# Patient Record
Sex: Male | Born: 1955 | Race: Black or African American | Hispanic: No | Marital: Single | Smoking: Current every day smoker
Health system: Southern US, Community
[De-identification: ages and names within clinical notes are randomized; demographics above are authoritative.]

## PROBLEM LIST (undated history)

## (undated) ENCOUNTER — Emergency Department: Discharge status: Absent without leave | Payer: Medicaid Other

## (undated) DIAGNOSIS — M1611 Unilateral primary osteoarthritis, right hip: Secondary | ICD-10-CM

---

## 2005-12-02 ENCOUNTER — Emergency Department: Payer: Self-pay

## 2006-12-31 ENCOUNTER — Emergency Department: Payer: Self-pay | Admitting: Emergency Medicine

## 2007-12-29 ENCOUNTER — Emergency Department: Payer: Self-pay

## 2010-04-14 ENCOUNTER — Emergency Department: Payer: Self-pay | Admitting: Emergency Medicine

## 2010-08-27 ENCOUNTER — Emergency Department: Payer: Self-pay | Admitting: Internal Medicine

## 2010-12-28 ENCOUNTER — Emergency Department: Payer: Self-pay | Admitting: *Deleted

## 2012-08-20 ENCOUNTER — Emergency Department: Payer: Self-pay | Admitting: Emergency Medicine

## 2012-09-01 ENCOUNTER — Emergency Department: Payer: Self-pay | Admitting: Emergency Medicine

## 2012-10-19 ENCOUNTER — Emergency Department: Payer: Self-pay | Admitting: Emergency Medicine

## 2012-10-19 LAB — CBC
HCT: 39 % — ABNORMAL LOW (ref 40.0–52.0)
HGB: 12.4 g/dL — ABNORMAL LOW (ref 13.0–18.0)
MCH: 24.7 pg — ABNORMAL LOW (ref 26.0–34.0)
MCHC: 31.9 g/dL — ABNORMAL LOW (ref 32.0–36.0)
MCV: 77 fL — ABNORMAL LOW (ref 80–100)
Platelet: 294 10*3/uL (ref 150–440)
WBC: 6.2 10*3/uL (ref 3.8–10.6)

## 2012-10-19 LAB — COMPREHENSIVE METABOLIC PANEL
Albumin: 3.5 g/dL (ref 3.4–5.0)
Alkaline Phosphatase: 99 U/L (ref 50–136)
Co2: 25 mmol/L (ref 21–32)
Creatinine: 1.21 mg/dL (ref 0.60–1.30)
EGFR (Non-African Amer.): >60
Glucose: 96 mg/dL (ref 65–99)
Osmolality: 279 (ref 275–301)
Potassium: 4.2 mmol/L (ref 3.5–5.1)
SGOT(AST): 27 U/L (ref 15–37)
SGPT (ALT): 26 U/L (ref 12–78)

## 2012-10-19 LAB — URIC ACID: Uric Acid: 6.1 mg/dL (ref 3.5–7.2)

## 2012-10-24 LAB — CULTURE, BLOOD (SINGLE)

## 2013-10-27 ENCOUNTER — Emergency Department: Payer: Self-pay | Admitting: Emergency Medicine

## 2013-10-27 LAB — COMPREHENSIVE METABOLIC PANEL
ANION GAP: 5 — AB (ref 7–16)
Albumin: 3.5 g/dL (ref 3.4–5.0)
Alkaline Phosphatase: 101 U/L
BUN: 7 mg/dL (ref 7–18)
Bilirubin,Total: 0.2 mg/dL (ref 0.2–1.0)
CHLORIDE: 109 mmol/L — AB (ref 98–107)
Calcium, Total: 8.2 mg/dL — ABNORMAL LOW (ref 8.5–10.1)
Co2: 24 mmol/L (ref 21–32)
Creatinine: 0.93 mg/dL (ref 0.60–1.30)
EGFR (African American): >60
EGFR (Non-African Amer.): >60
Glucose: 132 mg/dL — ABNORMAL HIGH (ref 65–99)
OSMOLALITY: 276 (ref 275–301)
POTASSIUM: 4.2 mmol/L (ref 3.5–5.1)
SGOT(AST): 23 U/L (ref 15–37)
SGPT (ALT): 31 U/L
SODIUM: 138 mmol/L (ref 136–145)
TOTAL PROTEIN: 7.7 g/dL (ref 6.4–8.2)

## 2013-10-27 LAB — CBC WITH DIFFERENTIAL/PLATELET
BASOS PCT: 0.9 %
Basophil #: 0.1 10*3/uL (ref 0.0–0.1)
EOS ABS: 0.1 10*3/uL (ref 0.0–0.7)
EOS PCT: 1.6 %
HCT: 43.5 % (ref 40.0–52.0)
HGB: 13.1 g/dL (ref 13.0–18.0)
Lymphocyte #: 0.9 10*3/uL — ABNORMAL LOW (ref 1.0–3.6)
Lymphocyte %: 14.2 %
MCH: 23.8 pg — ABNORMAL LOW (ref 26.0–34.0)
MCHC: 30.1 g/dL — AB (ref 32.0–36.0)
MCV: 79 fL — AB (ref 80–100)
Monocyte #: 0.5 x10 3/mm (ref 0.2–1.0)
Monocyte %: 8.7 %
Neutrophil #: 4.6 10*3/uL (ref 1.4–6.5)
Neutrophil %: 74.6 %
Platelet: 288 10*3/uL (ref 150–440)
RBC: 5.5 10*6/uL (ref 4.40–5.90)
RDW: 15.9 % — AB (ref 11.5–14.5)
WBC: 6.1 10*3/uL (ref 3.8–10.6)

## 2013-10-27 LAB — TROPONIN I

## 2013-10-27 LAB — LIPASE, BLOOD: Lipase: 92 U/L (ref 73–393)

## 2014-01-30 HISTORY — PX: JOINT REPLACEMENT: SHX530

## 2014-07-27 ENCOUNTER — Emergency Department: Payer: Self-pay

## 2014-07-27 ENCOUNTER — Emergency Department
Admission: EM | Admit: 2014-07-27 | Discharge: 2014-07-27 | Disposition: A | Discharge status: Home / Self Care | Payer: Self-pay | Attending: Student | Admitting: Student

## 2014-07-27 ENCOUNTER — Encounter: Payer: Self-pay | Admitting: Emergency Medicine

## 2014-07-27 DIAGNOSIS — X58XXXA Exposure to other specified factors, initial encounter: Secondary | ICD-10-CM | POA: Insufficient documentation

## 2014-07-27 DIAGNOSIS — R252 Cramp and spasm: Secondary | ICD-10-CM

## 2014-07-27 DIAGNOSIS — Y9289 Other specified places as the place of occurrence of the external cause: Secondary | ICD-10-CM | POA: Insufficient documentation

## 2014-07-27 DIAGNOSIS — R52 Pain, unspecified: Secondary | ICD-10-CM

## 2014-07-27 DIAGNOSIS — Y998 Other external cause status: Secondary | ICD-10-CM | POA: Insufficient documentation

## 2014-07-27 DIAGNOSIS — Y9389 Activity, other specified: Secondary | ICD-10-CM | POA: Insufficient documentation

## 2014-07-27 DIAGNOSIS — Z720 Tobacco use: Secondary | ICD-10-CM | POA: Insufficient documentation

## 2014-07-27 DIAGNOSIS — S39012A Strain of muscle, fascia and tendon of lower back, initial encounter: Secondary | ICD-10-CM | POA: Insufficient documentation

## 2014-07-27 MED ORDER — METHOCARBAMOL 500 MG PO TABS: 500.0000 mg | ORAL_TABLET | Freq: Four times a day (QID) | ORAL | Status: DC | PRN

## 2014-07-27 MED ORDER — OXYCODONE-ACETAMINOPHEN 5-325 MG PO TABS
1.0000 | ORAL_TABLET | Freq: Once | ORAL | Status: AC
  Administered 2014-07-27: 1 via ORAL

## 2014-07-27 MED ORDER — KETOROLAC TROMETHAMINE 60 MG/2ML IM SOLN
INTRAMUSCULAR | Status: AC
  Administered 2014-07-27: 60 mg via INTRAMUSCULAR
  Filled 2014-07-27: qty 2

## 2014-07-27 MED ORDER — OXYCODONE-ACETAMINOPHEN 5-325 MG PO TABS
ORAL_TABLET | ORAL | Status: AC
  Administered 2014-07-27: 1 via ORAL
  Filled 2014-07-27: qty 1

## 2014-07-27 MED ORDER — PREDNISONE 20 MG PO TABS
60.0000 mg | ORAL_TABLET | Freq: Once | ORAL | Status: AC
  Administered 2014-07-27: 60 mg via ORAL

## 2014-07-27 MED ORDER — IBUPROFEN 800 MG PO TABS: 800.0000 mg | ORAL_TABLET | Freq: Three times a day (TID) | ORAL | Status: DC | PRN

## 2014-07-27 MED ORDER — DIAZEPAM 5 MG/ML IJ SOLN
10.0000 mg | Freq: Once | INTRAMUSCULAR | Status: AC
  Administered 2014-07-27: 10 mg via INTRAMUSCULAR

## 2014-07-27 MED ORDER — DIAZEPAM 5 MG/ML IJ SOLN
INTRAMUSCULAR | Status: AC
  Administered 2014-07-27: 10 mg via INTRAMUSCULAR
  Filled 2014-07-27: qty 2

## 2014-07-27 MED ORDER — PREDNISONE 20 MG PO TABS
ORAL_TABLET | ORAL | Status: AC
  Administered 2014-07-27: 60 mg via ORAL
  Filled 2014-07-27: qty 3

## 2014-07-27 MED ORDER — TRAMADOL HCL 50 MG PO TABS: 50.0000 mg | ORAL_TABLET | Freq: Four times a day (QID) | ORAL | Status: DC | PRN

## 2014-07-27 MED ORDER — KETOROLAC TROMETHAMINE 60 MG/2ML IM SOLN
60.0000 mg | Freq: Once | INTRAMUSCULAR | Status: AC
  Administered 2014-07-27: 60 mg via INTRAMUSCULAR

## 2014-07-27 NOTE — ED Provider Notes (Signed)
[facility] Emergency Department Provider Note  ____________________________________________  Time seen: 1029  I have reviewed the triage vital signs and the nursing notes.   HISTORY  Chief Complaint Back Pain    HPI Charles Austin is a 59 y.o. male arrives here today with low back tightness and pain started just prior to his arrival in the department said the stepdown felt something pull in his back and it locked up rates it as about a 10 out of 10 pain nothing making it particularly better or worse denies any other symptoms of note at this time here today for further evaluation and treatment of his pain   History reviewed. No pertinent past medical history.  There are no active problems to display for this patient.   History reviewed. No pertinent past surgical history.  Current Outpatient Rx  Name  Route  Sig  Dispense  Refill  . ibuprofen (ADVIL,MOTRIN) 800 MG tablet   Oral   Take 1 tablet (800 mg total) by mouth every 8 (eight) hours as needed for mild pain or moderate pain.   30 tablet   a   . methocarbamol (ROBAXIN) 500 MG tablet   Oral   Take 1 tablet (500 mg total) by mouth every 6 (six) hours as needed for muscle spasms.   20 tablet   0   . traMADol (ULTRAM) 50 MG tablet   Oral   Take 1 tablet (50 mg total) by mouth every 6 (six) hours as needed.   12 tablet   0     Allergies Review of patient's allergies indicates no known allergies.  No family history on file.  Social History History  Substance Use Topics  . Smoking status: Current Every Day Smoker  . Smokeless tobacco: Not on file  . Alcohol Use: Yes    Review of Systems Constitutional: No fever/chills Eyes: No visual changes. ENT: No sore throat. Cardiovascular: Denies chest pain. Respiratory: Denies shortness of breath. Gastrointestinal: No abdominal pain.  No nausea, no vomiting.  No diarrhea.  No constipation. Genitourinary: Negative for  dysuria. Musculoskeletal: Negative for back pain. Skin: Negative for rash. Neurological: Negative for headaches, focal weakness or numbness.  10-point ROS otherwise negative.  ____________________________________________   PHYSICAL EXAM:  VITAL SIGNS: ED Triage Vitals  Enc Vitals Group     BP 07/27/14 0950 130/72 mmHg     Pulse Rate 07/27/14 0950 81     Resp 07/27/14 0950 18     Temp 07/27/14 0950 98.9 F (37.2 C)     Temp Source 07/27/14 0950 Oral     SpO2 07/27/14 0950 96 %     Weight 07/27/14 0950 260 lb (117.935 kg)     Height 07/27/14 0950 6\' 3"  (1.905 m)     Head Cir --      Peak Flow --      Pain Score 07/27/14 1001 8     Pain Loc --      Pain Edu? --      Excl. in GC? --     Constitutional: Alert and oriented. Well appearing and in no acute distress. Eyes: Conjunctivae are normal. PERRL. EOMI. Head: Atraumatic. Nose: No congestion/rhinnorhea. Mouth/Throat: Mucous membranes are moist.  Oropharynx non-erythematous. Neck: No stridor.   Cardiovascular: Normal rate, regular rhythm. Grossly normal heart sounds.  Good peripheral circulation. Respiratory: Normal respiratory effort.  No retractions. Lungs CTAB. Gastrointestinal: Soft and nontender. No distention. No abdominal bruits. No CVA tenderness. Musculoskeletal: Tight spasm muscles  across his low back no palpable step-offs or deformities Neurologic:  Normal speech and language. No gross focal neurologic deficits are appreciated. Speech is normal. No gait instability. Negative straight leg bilaterally Skin:  Skin is warm, dry and intact. No rash noted. Psychiatric: Mood and affect are normal. Speech and behavior are normal.  ____________________________________________   RADIOLOGY  IMPRESSION: No acute fracture or subluxation. Degenerative changes as described above.   Electronically Signed By: Natasha Mead M.D. On: 07/27/2014 11:27  IMPRESSION: No acute fracture or subluxation. Multilevel  degenerative changes as described above.   Electronically Signed By: Natasha Mead M.D. On: 07/27/2014 11:29      I, Ajai Terhaar,  Neely Kammerer, Kristine Garbe, personally viewed and evaluated these images as part of my medical decision making.  ____________________________________________   PROCEDURES  Procedure(s) performed: None  Critical Care performed: No  ____________________________________________   INITIAL IMPRESSION / ASSESSMENT AND PLAN / ED COURSE  Pertinent labs & imaging results that were available during my care of the patient were reviewed by me and considered in my medical decision making (see chart for details).  Initial impression on this patient lumbar sacral strain arthritis muscle spasm given the patient's mechanism of injury and lack of neurological deficits and otherwise reassuring physical exam was discussed with him that he needs to be on regular anti-inflammatory square back brace at work and given him medicines for pain and muscle relaxation have him follow-up with his doctor as needed return if any acute concerns or worsening symptoms while in the department she received muscle relaxers steroid and Percocet with great relief ____________________________________________   FINAL CLINICAL IMPRESSION(S) / ED DIAGNOSES  Final diagnoses:  Pain  Lumbosacral strain, initial encounter  Spasm      Roosvelt Churchwell Rosalyn Gess, PA-C 07/27/14 1216  Gayla Doss, MD 07/27/14 1530

## 2014-07-27 NOTE — ED Notes (Signed)
Brought in via ems with back pain states his legs gave out and ..having lower back pain

## 2014-07-27 NOTE — ED Notes (Signed)
States he felt like his back just gave way. But did not fall. But has had pain to hip area.

## 2015-01-31 DIAGNOSIS — M1611 Unilateral primary osteoarthritis, right hip: Secondary | ICD-10-CM

## 2015-01-31 HISTORY — DX: Unilateral primary osteoarthritis, right hip: M16.11

## 2015-09-28 ENCOUNTER — Emergency Department: Payer: Medicaid Other

## 2015-09-28 ENCOUNTER — Emergency Department
Admission: EM | Admit: 2015-09-28 | Discharge: 2015-09-28 | Disposition: A | Discharge status: Home / Self Care | Payer: Medicaid Other | Attending: Emergency Medicine | Admitting: Emergency Medicine

## 2015-09-28 DIAGNOSIS — F17200 Nicotine dependence, unspecified, uncomplicated: Secondary | ICD-10-CM | POA: Insufficient documentation

## 2015-09-28 DIAGNOSIS — Y999 Unspecified external cause status: Secondary | ICD-10-CM | POA: Insufficient documentation

## 2015-09-28 DIAGNOSIS — M791 Myalgia: Secondary | ICD-10-CM | POA: Diagnosis not present

## 2015-09-28 DIAGNOSIS — W010XXA Fall on same level from slipping, tripping and stumbling without subsequent striking against object, initial encounter: Secondary | ICD-10-CM | POA: Insufficient documentation

## 2015-09-28 DIAGNOSIS — Z791 Long term (current) use of non-steroidal anti-inflammatories (NSAID): Secondary | ICD-10-CM | POA: Insufficient documentation

## 2015-09-28 DIAGNOSIS — R0781 Pleurodynia: Secondary | ICD-10-CM | POA: Diagnosis not present

## 2015-09-28 DIAGNOSIS — Z79899 Other long term (current) drug therapy: Secondary | ICD-10-CM | POA: Diagnosis not present

## 2015-09-28 DIAGNOSIS — Y929 Unspecified place or not applicable: Secondary | ICD-10-CM | POA: Insufficient documentation

## 2015-09-28 DIAGNOSIS — Y939 Activity, unspecified: Secondary | ICD-10-CM | POA: Diagnosis not present

## 2015-09-28 DIAGNOSIS — W19XXXA Unspecified fall, initial encounter: Secondary | ICD-10-CM

## 2015-09-28 DIAGNOSIS — M7918 Myalgia, other site: Secondary | ICD-10-CM

## 2015-09-28 DIAGNOSIS — R109 Unspecified abdominal pain: Secondary | ICD-10-CM | POA: Diagnosis not present

## 2015-09-28 DIAGNOSIS — R079 Chest pain, unspecified: Secondary | ICD-10-CM | POA: Diagnosis present

## 2015-09-28 LAB — COMPREHENSIVE METABOLIC PANEL
ALK PHOS: 86 U/L (ref 38–126)
ALT: 27 U/L (ref 17–63)
ANION GAP: 5 (ref 5–15)
AST: 22 U/L (ref 15–41)
Albumin: 4 g/dL (ref 3.5–5.0)
BILIRUBIN TOTAL: 0.3 mg/dL (ref 0.3–1.2)
BUN: 13 mg/dL (ref 6–20)
CALCIUM: 9 mg/dL (ref 8.9–10.3)
CO2: 25 mmol/L (ref 22–32)
Chloride: 108 mmol/L (ref 101–111)
Creatinine, Ser: 1.05 mg/dL (ref 0.61–1.24)
Glucose, Bld: 157 mg/dL — ABNORMAL HIGH (ref 65–99)
Potassium: 4 mmol/L (ref 3.5–5.1)
Sodium: 138 mmol/L (ref 135–145)
Total Protein: 7.7 g/dL (ref 6.5–8.1)

## 2015-09-28 LAB — CBC
HEMATOCRIT: 41.9 % (ref 40.0–52.0)
HEMOGLOBIN: 13.6 g/dL (ref 13.0–18.0)
MCH: 25 pg — AB (ref 26.0–34.0)
MCHC: 32.5 g/dL (ref 32.0–36.0)
MCV: 77.2 fL — AB (ref 80.0–100.0)
Platelets: 255 10*3/uL (ref 150–440)
RBC: 5.42 MIL/uL (ref 4.40–5.90)
RDW: 15.6 % — ABNORMAL HIGH (ref 11.5–14.5)
WBC: 8.2 10*3/uL (ref 3.8–10.6)

## 2015-09-28 LAB — LIPASE, BLOOD: LIPASE: 26 U/L (ref 11–51)

## 2015-09-28 MED ORDER — DIAZEPAM 2 MG PO TABS: 2.0000 mg | ORAL_TABLET | Freq: Three times a day (TID) | ORAL | 0 refills | Status: DC | PRN

## 2015-09-28 MED ORDER — ONDANSETRON HCL 4 MG/2ML IJ SOLN
4.0000 mg | Freq: Once | INTRAMUSCULAR | Status: AC
  Administered 2015-09-28: 4 mg via INTRAVENOUS
  Filled 2015-09-28: qty 2

## 2015-09-28 MED ORDER — TRAMADOL HCL 50 MG PO TABS
50.0000 mg | ORAL_TABLET | Freq: Four times a day (QID) | ORAL | 0 refills | Status: DC | PRN
Start: 2015-09-28 — End: 2015-12-22

## 2015-09-28 MED ORDER — MORPHINE SULFATE (PF) 4 MG/ML IV SOLN
4.0000 mg | Freq: Once | INTRAVENOUS | Status: AC
  Administered 2015-09-28: 4 mg via INTRAVENOUS
  Filled 2015-09-28: qty 1

## 2015-09-28 MED ORDER — IOPAMIDOL (ISOVUE-300) INJECTION 61%
100.0000 mL | Freq: Once | INTRAVENOUS | Status: AC | PRN
  Administered 2015-09-28: 100 mL via INTRAVENOUS

## 2015-09-28 MED ORDER — ETODOLAC 200 MG PO CAPS: 200.0000 mg | ORAL_CAPSULE | Freq: Three times a day (TID) | ORAL | 0 refills | Status: DC

## 2015-09-28 NOTE — ED Provider Notes (Signed)
[facility] Emergency Department Provider Note   ____________________________________________   First MD Initiated Contact with Patient 09/28/15 [phone]     (approximate)  I have reviewed the triage vital signs and the nursing notes.   HISTORY  Chief Complaint Fall    HPI Charles Austin is a 60 y.o. male who comes into the hospital today with some left-sided chest pain. The patient reports that he thinks that he broke some ribs. He was walking into a store and reports that he was going down the incline his feet slipped from under him. The patient reports he fell on his left side. This occurred around 6:54 PM. He reports that the pain has been getting worse and worse all night. He has not taken anything for pain. The patient denies any shortness of breath and rates his pain a 10 out of 10 in intensity currently. He reports that he feels that his insides are injured. He is also having some abdominal pain. He denies any dizziness, lightheadedness, chest pain. The patient couldn't tolerate the pain anymore home so he came in for evaluation.   History reviewed. No pertinent past medical history.  There are no active problems to display for this patient.   History reviewed. No pertinent surgical history.  Prior to Admission medications   Medication Sig Start Date End Date Taking? Authorizing Provider  diazepam (VALIUM) 2 MG tablet Take 1 tablet (2 mg total) by mouth every 8 (eight) hours as needed for anxiety. 09/28/15 09/27/16  Rebecka Apley, MD  etodolac (LODINE) 200 MG capsule Take 1 capsule (200 mg total) by mouth every 8 (eight) hours. 09/28/15   Rebecka Apley, MD  ibuprofen (ADVIL,MOTRIN) 800 MG tablet Take 1 tablet (800 mg total) by mouth every 8 (eight) hours as needed for mild pain or moderate pain. 07/27/14   III Kristine Garbe Ruffian, PA-C  methocarbamol (ROBAXIN) 500 MG tablet Take 1 tablet (500 mg total) by mouth every 6 (six) hours as needed for  muscle spasms. 07/27/14   III Kristine Garbe Ruffian, PA-C  traMADol (ULTRAM) 50 MG tablet Take 1 tablet (50 mg total) by mouth every 6 (six) hours as needed. 07/27/14   III Kristine Garbe Ruffian, PA-C  traMADol (ULTRAM) 50 MG tablet Take 1 tablet (50 mg total) by mouth every 6 (six) hours as needed. 09/28/15   Rebecka Apley, MD    Allergies Review of patient's allergies indicates no known allergies.  No family history on file.  Social History Social History  Substance Use Topics  . Smoking status: Current Every Day Smoker    Packs/day: 0.50  . Smokeless tobacco: Never Used  . Alcohol use No    Review of Systems Constitutional: No fever/chills Eyes: No visual changes. ENT: No sore throat. Cardiovascular:Left chest pain. Respiratory: Denies shortness of breath. Gastrointestinal:  abdominal pain.  No nausea, no vomiting.  No diarrhea.  No constipation. Genitourinary: Negative for dysuria. Musculoskeletal: Negative for back pain. Skin: Negative for rash. Neurological: Negative for headaches, focal weakness or numbness. 10-point ROS otherwise negative.  ____________________________________________   PHYSICAL EXAM:  VITAL SIGNS: ED Triage Vitals [09/28/15 0352]  Enc Vitals Group     BP (!) 153/92     Pulse Rate 75     Resp 20     Temp 98.2 F (36.8 C)     Temp Source Oral     SpO2 98 %     Weight 265 lb (120.2 kg)  Height 6\' 2"  (1.88 m)     Head Circumference      Peak Flow      Pain Score 10     Pain Loc      Pain Edu?      Excl. in GC?     Constitutional: Alert and oriented. Well appearing and in Moderate distress. Eyes: Conjunctivae are normal. PERRL. EOMI. Head: Atraumatic. Nose: No congestion/rhinnorhea. Mouth/Throat: Mucous membranes are moist.  Oropharynx non-erythematous. Neck: No cervical spine tenderness to palpation. Cardiovascular: Normal rate, regular rhythm. Grossly normal heart sounds.  Good peripheral circulation. Respiratory: Normal respiratory  effort.  No retractions. Lungs CTAB. Left chest wall tenderness to palpation Gastrointestinal: Soft with left upper quadrant tenderness to palpation. No distention. Positive bowel sounds Musculoskeletal: No lower extremity tenderness nor edema.   Neurologic:  Normal speech and language. Skin:  Skin is warm, dry and intact.  Psychiatric: Mood and affect are normal.   ____________________________________________   LABS (all labs ordered are listed, but only abnormal results are displayed)  Labs Reviewed  CBC - Abnormal; Notable for the following:       Result Value   MCV 77.2 (*)    MCH 25.0 (*)    RDW 15.6 (*)    All other components within normal limits  COMPREHENSIVE METABOLIC PANEL - Abnormal; Notable for the following:    Glucose, Bld 157 (*)    All other components within normal limits  LIPASE, BLOOD   ____________________________________________  EKG  none ____________________________________________  RADIOLOGY  CXR CT abd and pelvis ____________________________________________   PROCEDURES  Procedure(s) performed: None  Procedures  Critical Care performed: No  ____________________________________________   INITIAL IMPRESSION / ASSESSMENT AND PLAN / ED COURSE  Pertinent labs & imaging results that were available during my care of the patient were reviewed by me and considered in my medical decision making (see chart for details).  This is a 60 year old male who comes into the hospital today with some left-sided chest pain after a fall. The patient also has some abdominal pain. He will receive a dose of morphine as well as Zofran. I will check a CT scan on the patient as well as an x-ray looking for fractured ribs. The patient will be reassessed once I received the results of his blood work and his imaging studies.  Clinical Course  Value Comment By Time  DG Chest 2 View 1. Right-greater-than-left masslike hilar opacities, likely adenopathy. CT of the  chest is recommended for more complete evaluation. 2. No focal airspace disease or pulmonary edema.   Rebecka ApleyAllison P Vilda Zollner, MD 08/29 [phone]  CT Abdomen Pelvis W Contrast 1. No acute findings identified to explain the reported left upper quadrant pain. 2. Hyperdense material within the right lower quadrant ileum is favored to be ingested material such is medication, given the multiple hyperdensities seen in the stomach and jejunum, which are of similar attenuation value. Blood products within the small bowel lumen are considered less likely. 3. Aortic atherosclerosis.   Rebecka ApleyAllison P Marianne Golightly, MD 08/29 [phone]   The patient's pain is improved. I informed him that I did not see any fractures on the x-ray but he did have some enlarged lymph nodes which needed to be evaluated. I encourage the patient also that he may have a bruise which will still take a few weeks for the pain did improve. As the patient feels comfortable he'll be discharged home with some medication to help with his pain. The patient's follow-up with his primary  care physician. He should also get his lymphadenopathy evaluated. The patient has no further questions or concerns at this time.  ____________________________________________   FINAL CLINICAL IMPRESSION(S) / ED DIAGNOSES  Final diagnoses:  Rib pain on left side  Fall, initial encounter  Musculoskeletal pain      NEW MEDICATIONS STARTED DURING THIS VISIT:  New Prescriptions   DIAZEPAM (VALIUM) 2 MG TABLET    Take 1 tablet (2 mg total) by mouth every 8 (eight) hours as needed for anxiety.   ETODOLAC (LODINE) 200 MG CAPSULE    Take 1 capsule (200 mg total) by mouth every 8 (eight) hours.   TRAMADOL (ULTRAM) 50 MG TABLET    Take 1 tablet (50 mg total) by mouth every 6 (six) hours as needed.     Note:  This document was prepared using Dragon voice recognition software and may include unintentional dictation errors.    Rebecka Apley, MD 09/28/15 [phone]

## 2015-09-28 NOTE — ED Notes (Signed)
Pt given incentive spirometer and instructed how to use. 

## 2015-09-28 NOTE — ED Notes (Signed)
Pt arrived via ems - c/o fall 2 hours ago and since fall having left sided rib pain - pain is worse with inspiration and movement - respirations are even and unlabored at this time - pt states he was walking to the store and his feet slipped on some uneven pavement

## 2015-09-28 NOTE — ED Triage Notes (Signed)
Pt arrived via ems - c/o fall 2 hours ago and since fall having left sided rib pain - pain is worse with inspiration and movement

## 2015-11-02 ENCOUNTER — Emergency Department: Payer: Medicaid Other

## 2015-11-02 ENCOUNTER — Emergency Department
Admission: EM | Admit: 2015-11-02 | Discharge: 2015-11-02 | Disposition: A | Discharge status: Home / Self Care | Payer: Medicaid Other | Attending: Emergency Medicine | Admitting: Emergency Medicine

## 2015-11-02 ENCOUNTER — Encounter: Payer: Self-pay | Admitting: *Deleted

## 2015-11-02 DIAGNOSIS — F17200 Nicotine dependence, unspecified, uncomplicated: Secondary | ICD-10-CM | POA: Insufficient documentation

## 2015-11-02 DIAGNOSIS — M25551 Pain in right hip: Secondary | ICD-10-CM | POA: Insufficient documentation

## 2015-11-02 MED ORDER — ONDANSETRON HCL 4 MG/2ML IJ SOLN
4.0000 mg | Freq: Once | INTRAMUSCULAR | Status: AC
  Administered 2015-11-02: 4 mg via INTRAVENOUS
  Filled 2015-11-02: qty 2

## 2015-11-02 MED ORDER — HYDROMORPHONE HCL 1 MG/ML IJ SOLN
0.5000 mg | Freq: Once | INTRAMUSCULAR | Status: AC
  Administered 2015-11-02: 0.5 mg via INTRAVENOUS
  Filled 2015-11-02: qty 1

## 2015-11-02 NOTE — ED Provider Notes (Signed)
[facility] Emergency Department Provider Note   ____________________________________________   First MD Initiated Contact with Patient 11/02/15 1941     (approximate)  I have reviewed the triage vital signs and the nursing notes.   HISTORY  Chief Complaint Hip Pain    HPI LIONELL MATUSZAK is a 60 y.o. male who has a history of hip pain. He reports he was getting off the commode and felt his right hip pop. Pop very loudly. Now it hurts a lot and has a lot of difficulty putting any weight on it at all. He did not fall. He had no other known injury. Pain is severe achy and worse if he bears weight.   History reviewed. No pertinent past medical history.  There are no active problems to display for this patient.   No past surgical history on file.  Prior to Admission medications   Medication Sig Start Date End Date Taking? Authorizing Provider  diazepam (VALIUM) 2 MG tablet Take 1 tablet (2 mg total) by mouth every 8 (eight) hours as needed for anxiety. 09/28/15 09/27/16  Rebecka Apley, MD  etodolac (LODINE) 200 MG capsule Take 1 capsule (200 mg total) by mouth every 8 (eight) hours. 09/28/15   Rebecka Apley, MD  ibuprofen (ADVIL,MOTRIN) 800 MG tablet Take 1 tablet (800 mg total) by mouth every 8 (eight) hours as needed for mild pain or moderate pain. 07/27/14   III Kristine Garbe Ruffian, PA-C  methocarbamol (ROBAXIN) 500 MG tablet Take 1 tablet (500 mg total) by mouth every 6 (six) hours as needed for muscle spasms. 07/27/14   III Kristine Garbe Ruffian, PA-C  traMADol (ULTRAM) 50 MG tablet Take 1 tablet (50 mg total) by mouth every 6 (six) hours as needed. 07/27/14   III Kristine Garbe Ruffian, PA-C  traMADol (ULTRAM) 50 MG tablet Take 1 tablet (50 mg total) by mouth every 6 (six) hours as needed. 09/28/15   Rebecka Apley, MD    Allergies Review of patient's allergies indicates no known allergies.  No family history on file.  Social History Social History    Substance Use Topics  . Smoking status: Current Every Day Smoker    Packs/day: 0.50  . Smokeless tobacco: Never Used  . Alcohol use No    Review of Systems Constitutional: No fever/chills Eyes: No visual changes. ENT: No sore throat. Cardiovascular: Denies chest pain. Respiratory: Denies shortness of breath. Gastrointestinal: No abdominal pain.  No nausea, no vomiting.  No diarrhea.  No constipation. Genitourinary: Negative for dysuria. Musculoskeletal: Negative for back pain. Skin: Negative for rash. Neurological: Negative for headaches, focal weakness or numbness.  10-point ROS otherwise negative.  ____________________________________________   PHYSICAL EXAM:  VITAL SIGNS: ED Triage Vitals  Enc Vitals Group     BP 11/02/15 1741 (!) 172/80     Pulse Rate 11/02/15 1741 74     Resp 11/02/15 1741 20     Temp 11/02/15 1741 99.1 F (37.3 C)     Temp Source 11/02/15 1741 Oral     SpO2 11/02/15 1741 98 %     Weight 11/02/15 1743 265 lb (120.2 kg)     Height 11/02/15 1743 6\' 2"  (1.88 m)     Head Circumference --      Peak Flow --      Pain Score 11/02/15 1743 10     Pain Loc --      Pain Edu? --      Excl. in GC? --  Constitutional: Alert and oriented. Well appearing and in no acute distress. Eyes: Conjunctivae are normal. PERRL. EOMI. Head: Atraumatic. Nose: No congestion/rhinnorhea. Mouth/Throat: Mucous membranes are moist.  Oropharynx non-erythematous. Neck: No stridor.  Cardiovascular: Normal rate, regular rhythm. Grossly normal heart sounds.  Good peripheral circulation. Respiratory: Normal respiratory effort.  No retractions. Lungs CTAB. Gastrointestinal: Soft and nontender. No distention. No abdominal bruits. No CVA tenderness. Musculoskeletal: Pain and tenderness in the right hip with palpation and movement. Neurologic:  Normal speech and language. No gross focal neurologic deficits are appreciated. No gait instability. Skin:  Skin is warm, dry and  intact. No rash noted.   ____________________________________________   LABS (all labs ordered are listed, but only abnormal results are displayed)  Labs Reviewed - No data to display ____________________________________________  EKG   ____________________________________________  RADIOLOGY  Study Result   CLINICAL DATA:  60 year old male with fall and right hip pain.  EXAM: DG HIP (WITH OR WITHOUT PELVIS) 2-3V RIGHT  COMPARISON:  Pelvic radiograph dated 07/27/2014  FINDINGS: There is no acute fracture or dislocation. There is stable degenerative changes of the hip joints with spurring and osteophyte as seen on the prior radiograph. The bones are mildly osteopenic. Soft tissues are grossly unremarkable.  IMPRESSION: No acute fracture or dislocation.   Electronically Signed   By: Elgie CollardArash  Radparvar M.D.   On: 11/02/2015 18:41   Study Result   CLINICAL DATA:  [age] year old male with right hip pain.  EXAM: CT OF THE RIGHT HIP WITHOUT CONTRAST  TECHNIQUE: Multidetector CT imaging of the right hip was performed according to the standard protocol. Multiplanar CT image reconstructions were also generated.  COMPARISON:  Radiographs dated 11/02/2015, 07/27/2014 and abdominal CT dated 09/28/2015  FINDINGS: Bones/Joint/Cartilage  There is no acute fracture or dislocation. The bones are osteopenic. 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 There is fragmented appearance of the posterior acetabular wall as seen on the prior studies. There is osteophyte and bone spurring around the hip.  Ligaments  Suboptimally assessed by CT.  Muscles and Tendons  Unremarkable as visualized.  No hematoma.  No fluid collection.  Soft tissues  Unremarkable.  IMPRESSION: No acute fracture or dislocation.  Osteoarthritic changes with bone spurring and fragmented appearance of the  posterior acetabular wall as seen on the prior studies.   Electronically Signed   By: Elgie CollardArash  Radparvar M.D.   On: 11/02/2015 20:24    ____________________________________________   PROCEDURES  Procedure(s) performed:  Procedures  Critical Care performed:  ____________________________________________   INITIAL IMPRESSION / ASSESSMENT AND PLAN / ED COURSE  Pertinent labs & imaging results that were available during my care of the patient were reviewed by me and considered in my medical decision making (see chart for details).    Clinical Course     ____________________________________________   FINAL CLINICAL IMPRESSION(S) / ED DIAGNOSES  Final diagnoses:  Right hip pain      NEW MEDICATIONS STARTED DURING THIS VISIT:  New Prescriptions   No medications on file     Note:  This document was prepared using Dragon voice recognition software and may include unintentional dictation errors.    Arnaldo NatalPaul F Malinda, MD 11/02/15 [phone]

## 2015-11-02 NOTE — Discharge Instructions (Signed)
Use your Ultram if needed for the pain. Please follow-up with your doctor or see Dr. Rosita KeaMenz the orthopedic doctor to keep an eye on the hip. If you need more you can try the Percocet that I roughly one pill 4 times a day just for a few days. Be sure to follow up with Dr. Trilby DrummerManns or your regular doctor if it still hurting after that.

## 2015-11-02 NOTE — ED Notes (Signed)
Pt's wife called, pt given phone to speak with her. Pt requested food. MD wants to wait till CT results before food is given, pt made aware.

## 2015-11-02 NOTE — ED Triage Notes (Signed)
Pt brought in via ems from home with right hip pain.  Pt states he used the commode and right hip popped.  Painful to ambulate.  Denies other injury.

## 2015-11-02 NOTE — ED Notes (Signed)
Patient reports that he has a history of hip issues on affected side. Denies any surgeries.

## 2015-12-15 ENCOUNTER — Inpatient Hospital Stay: Admission: RE | Admit: 2015-12-15 | Payer: Medicaid Other | Source: Ambulatory Visit

## 2015-12-22 ENCOUNTER — Encounter
Admission: RE | Admit: 2015-12-22 | Discharge: 2015-12-22 | Disposition: A | Discharge status: Home / Self Care | Payer: Medicaid Other | Source: Ambulatory Visit | Attending: Orthopedic Surgery | Admitting: Orthopedic Surgery

## 2015-12-22 DIAGNOSIS — Z01812 Encounter for preprocedural laboratory examination: Secondary | ICD-10-CM | POA: Diagnosis present

## 2015-12-22 HISTORY — DX: Unilateral primary osteoarthritis, right hip: M16.11

## 2015-12-22 LAB — PROTIME-INR
INR: 1.11
Prothrombin Time: 14.4 seconds (ref 11.4–15.2)

## 2015-12-22 LAB — URINALYSIS COMPLETE WITH MICROSCOPIC (ARMC ONLY)
Bacteria, UA: NONE SEEN
Bilirubin Urine: NEGATIVE
GLUCOSE, UA: NEGATIVE mg/dL
Hgb urine dipstick: NEGATIVE
KETONES UR: NEGATIVE mg/dL
Leukocytes, UA: NEGATIVE
Nitrite: NEGATIVE
PROTEIN: NEGATIVE mg/dL
Specific Gravity, Urine: 1.018 (ref 1.005–1.030)
Squamous Epithelial / LPF: NONE SEEN
pH: 5 (ref 5.0–8.0)

## 2015-12-22 LAB — TYPE AND SCREEN
ABO/RH(D): O POS
ANTIBODY SCREEN: NEGATIVE

## 2015-12-22 LAB — CBC
HCT: 46 % (ref 40.0–52.0)
Hemoglobin: 14.9 g/dL (ref 13.0–18.0)
MCH: 24.6 pg — AB (ref 26.0–34.0)
MCHC: 32.3 g/dL (ref 32.0–36.0)
MCV: 76.2 fL — AB (ref 80.0–100.0)
PLATELETS: 256 10*3/uL (ref 150–440)
RBC: 6.04 MIL/uL — ABNORMAL HIGH (ref 4.40–5.90)
RDW: 15.5 % — AB (ref 11.5–14.5)
WBC: 5.9 10*3/uL (ref 3.8–10.6)

## 2015-12-22 LAB — SEDIMENTATION RATE: SED RATE: 2 mm/h (ref 0–20)

## 2015-12-22 LAB — BASIC METABOLIC PANEL
Anion gap: 7 (ref 5–15)
BUN: 13 mg/dL (ref 6–20)
CALCIUM: 9.6 mg/dL (ref 8.9–10.3)
CO2: 25 mmol/L (ref 22–32)
CREATININE: 0.98 mg/dL (ref 0.61–1.24)
Chloride: 104 mmol/L (ref 101–111)
GFR calc Af Amer: >60 mL/min (ref >60)
GLUCOSE: 159 mg/dL — AB (ref 65–99)
Potassium: 4.1 mmol/L (ref 3.5–5.1)
SODIUM: 136 mmol/L (ref 135–145)

## 2015-12-22 LAB — SURGICAL PCR SCREEN
MRSA, PCR: POSITIVE — AB
Staphylococcus aureus: POSITIVE — AB

## 2015-12-22 LAB — APTT: APTT: 30 s (ref 24–36)

## 2015-12-22 NOTE — Patient Instructions (Signed)
  Your procedure is scheduled on: Tuesday, December 28, 2015 Report to Same Day Surgery 2nd floor medical mall To find out your arrival time please call [phone] between 1PM - 3PM on Monday, November 27th  Remember: Instructions that are not followed completely may result in serious medical risk, up to and including death, or upon the discretion of your surgeon and anesthesiologist your surgery may need to be rescheduled.    _x___ 1. Do not eat food or drink liquids after midnight. No gum chewing or hard candies.     __x__ 2. No Alcohol for 24 hours before or after surgery.   __x__3. No Smoking for 24 prior to surgery.   ____  4. Bring all medications with you on the day of surgery if instructed.    __x__ 5. Notify your doctor if there is any change in your medical condition     (cold, fever, infections).     Do not wear jewelry, make-up, hairpins, clips or nail polish.  Do not wear lotions, powders, or perfumes. You may wear deodorant.  Do not shave 48 hours prior to surgery. Men may shave face and neck.  Do not bring valuables to the hospital.    [facility] is not responsible for any belongings or valuables.               Contacts, dentures or bridgework may not be worn into surgery.  Leave your suitcase in the car. After surgery it may be brought to your room.  For patients admitted to the hospital, discharge time is determined by your treatment team.   Patients discharged the day of surgery will not be allowed to drive home.  You will need someone to drive you home and stay with you the night of your procedure.    Please read over the following fact sheets that you were given:   [facility] Preparing for Surgery and or MRSA Information   _x___ Take these medicines the morning of surgery with A SIP OF WATER:    1. NONE  2.  3.  4.  5.  6.  ____Fleets enema or Magnesium Citrate as directed.   _x___ Use CHG Soap or sage wipes as directed on instruction sheet    ____ Use inhalers on the day of surgery and bring to hospital day of surgery  ____ Stop metformin 2 days prior to surgery    ____ Take 1/2 of usual insulin dose the night before surgery and none on the morning of           surgery.   ____ Stop Aspirin, Coumadin, Pllavix ,Eliquis, Effient, or Pradaxa  x__ Stop Anti-inflammatories such as Advil, Aleve, Ibuprofen, Motrin, Naproxen,          Naprosyn, Goodies powders or aspirin products. Ok to take Tylenol.   ____ Stop supplements until after surgery.    ____ Bring C-Pap to the hospital.

## 2015-12-23 LAB — URINE CULTURE: CULTURE: NO GROWTH

## 2015-12-27 MED ORDER — TRANEXAMIC ACID 1000 MG/10ML IV SOLN
1000.0000 mg | INTRAVENOUS | Status: AC
  Administered 2015-12-28: 1000 mg via INTRAVENOUS
  Filled 2015-12-27: qty 10

## 2015-12-27 MED ORDER — DEXTROSE 5 % IV SOLN
3.0000 g | Freq: Once | INTRAVENOUS | Status: AC
  Administered 2015-12-28: 3 g via INTRAVENOUS
  Filled 2015-12-27: qty 3000

## 2015-12-27 NOTE — Pre-Procedure Instructions (Signed)
Dr. Neomia GlassMenz's office notified per fax of +MRSA and Staph.  Ancef ordered as preop antibiotic. Requested patient be contacted if txt desired and contact PAT if antibiotic to be changed for preop.

## 2015-12-28 ENCOUNTER — Encounter
Admission: RE | Disposition: A | Discharge status: Home Health | Payer: Self-pay | Source: Ambulatory Visit | Attending: Orthopedic Surgery

## 2015-12-28 ENCOUNTER — Inpatient Hospital Stay: Payer: Medicaid Other

## 2015-12-28 ENCOUNTER — Encounter: Payer: Self-pay | Admitting: *Deleted

## 2015-12-28 ENCOUNTER — Inpatient Hospital Stay
Admission: RE | Admit: 2015-12-28 | Discharge: 2015-12-31 | DRG: 470 | Disposition: A | Discharge status: Home Health | Payer: Medicaid Other | Source: Ambulatory Visit | Attending: Orthopedic Surgery | Admitting: Orthopedic Surgery

## 2015-12-28 ENCOUNTER — Inpatient Hospital Stay: Payer: Medicaid Other | Admitting: Anesthesiology

## 2015-12-28 DIAGNOSIS — F17200 Nicotine dependence, unspecified, uncomplicated: Secondary | ICD-10-CM | POA: Diagnosis present

## 2015-12-28 DIAGNOSIS — M6281 Muscle weakness (generalized): Secondary | ICD-10-CM

## 2015-12-28 DIAGNOSIS — J449 Chronic obstructive pulmonary disease, unspecified: Secondary | ICD-10-CM | POA: Diagnosis present

## 2015-12-28 DIAGNOSIS — Z833 Family history of diabetes mellitus: Secondary | ICD-10-CM

## 2015-12-28 DIAGNOSIS — Z419 Encounter for procedure for purposes other than remedying health state, unspecified: Secondary | ICD-10-CM

## 2015-12-28 DIAGNOSIS — M25551 Pain in right hip: Secondary | ICD-10-CM | POA: Diagnosis present

## 2015-12-28 DIAGNOSIS — R2689 Other abnormalities of gait and mobility: Secondary | ICD-10-CM

## 2015-12-28 DIAGNOSIS — G8918 Other acute postprocedural pain: Secondary | ICD-10-CM

## 2015-12-28 DIAGNOSIS — M1611 Unilateral primary osteoarthritis, right hip: Secondary | ICD-10-CM | POA: Diagnosis present

## 2015-12-28 HISTORY — PX: TOTAL HIP ARTHROPLASTY: SHX124

## 2015-12-28 LAB — CBC
HEMATOCRIT: 42.1 % (ref 40.0–52.0)
HEMOGLOBIN: 13.5 g/dL (ref 13.0–18.0)
MCH: 24.5 pg — AB (ref 26.0–34.0)
MCHC: 32 g/dL (ref 32.0–36.0)
MCV: 76.6 fL — ABNORMAL LOW (ref 80.0–100.0)
Platelets: 243 10*3/uL (ref 150–440)
RBC: 5.49 MIL/uL (ref 4.40–5.90)
RDW: 15.6 % — ABNORMAL HIGH (ref 11.5–14.5)
WBC: 10.6 10*3/uL (ref 3.8–10.6)

## 2015-12-28 LAB — ABO/RH: ABO/RH(D): O POS

## 2015-12-28 LAB — CREATININE, SERUM
Creatinine, Ser: 1.05 mg/dL (ref 0.61–1.24)
GFR calc Af Amer: >60 mL/min (ref >60)
GFR calc non Af Amer: >60 mL/min (ref >60)

## 2015-12-28 SURGERY — ARTHROPLASTY, HIP, TOTAL, ANTERIOR APPROACH
Anesthesia: Spinal | Laterality: Right | Wound class: Clean

## 2015-12-28 MED ORDER — ONDANSETRON HCL 4 MG/2ML IJ SOLN: 4.0000 mg | Freq: Four times a day (QID) | INTRAMUSCULAR | Status: DC | PRN

## 2015-12-28 MED ORDER — SODIUM CHLORIDE 0.9 % IV SOLN
INTRAVENOUS | Status: DC
  Administered 2015-12-28: 21:00:00 via INTRAVENOUS

## 2015-12-28 MED ORDER — OXYCODONE HCL 5 MG PO TABS
5.0000 mg | ORAL_TABLET | ORAL | Status: DC | PRN
  Administered 2015-12-28 – 2015-12-31 (×6): 10 mg via ORAL
  Filled 2015-12-28 (×6): qty 2

## 2015-12-28 MED ORDER — OXYCODONE HCL 5 MG/5ML PO SOLN: 5.0000 mg | Freq: Once | ORAL | Status: DC | PRN

## 2015-12-28 MED ORDER — OXYCODONE HCL 5 MG PO TABS: 5.0000 mg | ORAL_TABLET | Freq: Once | ORAL | Status: DC | PRN

## 2015-12-28 MED ORDER — BUPIVACAINE-EPINEPHRINE 0.25% -1:200000 IJ SOLN
INTRAMUSCULAR | Status: DC | PRN
  Administered 2015-12-28: 30 mL

## 2015-12-28 MED ORDER — MENTHOL 3 MG MT LOZG
1.0000 | LOZENGE | OROMUCOSAL | Status: DC | PRN
  Filled 2015-12-28: qty 9

## 2015-12-28 MED ORDER — MAGNESIUM HYDROXIDE 400 MG/5ML PO SUSP: 30.0000 mL | Freq: Every day | ORAL | Status: DC | PRN

## 2015-12-28 MED ORDER — NEOMYCIN-POLYMYXIN B GU 40-200000 IR SOLN
Status: AC
  Filled 2015-12-28: qty 4

## 2015-12-28 MED ORDER — LACTATED RINGERS IV SOLN
INTRAVENOUS | Status: DC
  Administered 2015-12-28 (×3): via INTRAVENOUS

## 2015-12-28 MED ORDER — ZOLPIDEM TARTRATE 5 MG PO TABS: 5.0000 mg | ORAL_TABLET | Freq: Every evening | ORAL | Status: DC | PRN

## 2015-12-28 MED ORDER — BUPIVACAINE-EPINEPHRINE (PF) 0.25% -1:200000 IJ SOLN
INTRAMUSCULAR | Status: AC
  Filled 2015-12-28: qty 30

## 2015-12-28 MED ORDER — BISACODYL 10 MG RE SUPP: 10.0000 mg | Freq: Every day | RECTAL | Status: DC | PRN

## 2015-12-28 MED ORDER — ENOXAPARIN SODIUM 40 MG/0.4ML ~~LOC~~ SOLN
40.0000 mg | SUBCUTANEOUS | Status: DC
  Administered 2015-12-29 – 2015-12-31 (×3): 40 mg via SUBCUTANEOUS
  Filled 2015-12-28 (×3): qty 0.4

## 2015-12-28 MED ORDER — NEOMYCIN-POLYMYXIN B GU 40-200000 IR SOLN
Status: DC | PRN
  Administered 2015-12-28: 4 mL

## 2015-12-28 MED ORDER — ONDANSETRON HCL 4 MG PO TABS: 4.0000 mg | ORAL_TABLET | Freq: Four times a day (QID) | ORAL | Status: DC | PRN

## 2015-12-28 MED ORDER — MAGNESIUM CITRATE PO SOLN
1.0000 | Freq: Once | ORAL | Status: DC | PRN
  Filled 2015-12-28: qty 296

## 2015-12-28 MED ORDER — BUPIVACAINE HCL (PF) 0.5 % IJ SOLN
INTRAMUSCULAR | Status: DC | PRN
  Administered 2015-12-28: 3 mL

## 2015-12-28 MED ORDER — DOCUSATE SODIUM 100 MG PO CAPS
100.0000 mg | ORAL_CAPSULE | Freq: Two times a day (BID) | ORAL | Status: DC
  Administered 2015-12-28 – 2015-12-30 (×5): 100 mg via ORAL
  Filled 2015-12-28 (×5): qty 1

## 2015-12-28 MED ORDER — METOCLOPRAMIDE HCL 5 MG/ML IJ SOLN: 5.0000 mg | Freq: Three times a day (TID) | INTRAMUSCULAR | Status: DC | PRN

## 2015-12-28 MED ORDER — VANCOMYCIN HCL IN DEXTROSE 1-5 GM/200ML-% IV SOLN
1000.0000 mg | Freq: Once | INTRAVENOUS | Status: AC
  Administered 2015-12-28: 1000 mg via INTRAVENOUS

## 2015-12-28 MED ORDER — PHENYLEPHRINE HCL 10 MG/ML IJ SOLN
INTRAMUSCULAR | Status: DC | PRN
  Administered 2015-12-28 (×10): 100 ug via INTRAVENOUS

## 2015-12-28 MED ORDER — METHOCARBAMOL 1000 MG/10ML IJ SOLN
500.0000 mg | Freq: Four times a day (QID) | INTRAVENOUS | Status: DC | PRN
  Filled 2015-12-28: qty 5

## 2015-12-28 MED ORDER — FAMOTIDINE 20 MG PO TABS
20.0000 mg | ORAL_TABLET | Freq: Once | ORAL | Status: AC
  Administered 2015-12-28: 20 mg via ORAL

## 2015-12-28 MED ORDER — VANCOMYCIN HCL IN DEXTROSE 1-5 GM/200ML-% IV SOLN
INTRAVENOUS | Status: AC
  Filled 2015-12-28: qty 200

## 2015-12-28 MED ORDER — ACETAMINOPHEN 650 MG RE SUPP: 650.0000 mg | Freq: Four times a day (QID) | RECTAL | Status: DC | PRN

## 2015-12-28 MED ORDER — FAMOTIDINE 20 MG PO TABS
ORAL_TABLET | ORAL | Status: AC
  Filled 2015-12-28: qty 1

## 2015-12-28 MED ORDER — KETAMINE HCL 50 MG/ML IJ SOLN
INTRAMUSCULAR | Status: DC | PRN
  Administered 2015-12-28: 50 mg via INTRAMUSCULAR

## 2015-12-28 MED ORDER — METHOCARBAMOL 500 MG PO TABS: 500.0000 mg | ORAL_TABLET | Freq: Four times a day (QID) | ORAL | Status: DC | PRN

## 2015-12-28 MED ORDER — PROPOFOL 10 MG/ML IV BOLUS
INTRAVENOUS | Status: DC | PRN
  Administered 2015-12-28: 50 mg via INTRAVENOUS

## 2015-12-28 MED ORDER — MIDAZOLAM HCL 5 MG/5ML IJ SOLN
INTRAMUSCULAR | Status: DC | PRN
  Administered 2015-12-28: 2 mg via INTRAVENOUS

## 2015-12-28 MED ORDER — METOCLOPRAMIDE HCL 10 MG PO TABS: 5.0000 mg | ORAL_TABLET | Freq: Three times a day (TID) | ORAL | Status: DC | PRN

## 2015-12-28 MED ORDER — PROPOFOL 500 MG/50ML IV EMUL
INTRAVENOUS | Status: DC | PRN
  Administered 2015-12-28: 100 ug/kg/min via INTRAVENOUS

## 2015-12-28 MED ORDER — CEFAZOLIN SODIUM-DEXTROSE 2-4 GM/100ML-% IV SOLN
2.0000 g | Freq: Four times a day (QID) | INTRAVENOUS | Status: AC
  Administered 2015-12-28 – 2015-12-29 (×2): 2 g via INTRAVENOUS
  Filled 2015-12-28 (×2): qty 100

## 2015-12-28 MED ORDER — EPHEDRINE SULFATE 50 MG/ML IJ SOLN
INTRAMUSCULAR | Status: DC | PRN
  Administered 2015-12-28: 10 mg via INTRAVENOUS

## 2015-12-28 MED ORDER — PHENOL 1.4 % MT LIQD
1.0000 | OROMUCOSAL | Status: DC | PRN
  Filled 2015-12-28: qty 177

## 2015-12-28 MED ORDER — ACETAMINOPHEN 325 MG PO TABS
650.0000 mg | ORAL_TABLET | Freq: Four times a day (QID) | ORAL | Status: DC | PRN
  Administered 2015-12-31: 650 mg via ORAL
  Filled 2015-12-28: qty 2

## 2015-12-28 MED ORDER — MORPHINE SULFATE (PF) 2 MG/ML IV SOLN
2.0000 mg | INTRAVENOUS | Status: DC | PRN
  Administered 2015-12-28 – 2015-12-29 (×5): 2 mg via INTRAVENOUS
  Filled 2015-12-28 (×5): qty 1

## 2015-12-28 MED ORDER — FENTANYL CITRATE (PF) 100 MCG/2ML IJ SOLN: 25.0000 ug | INTRAMUSCULAR | Status: DC | PRN

## 2015-12-28 SURGICAL SUPPLY — 51 items
BLADE SAW SAG 18.5X105 (BLADE) ×2 IMPLANT
BNDG COHESIVE 6X5 TAN STRL LF (GAUZE/BANDAGES/DRESSINGS) ×4 IMPLANT
CANISTER SUCT 1200ML W/VALVE (MISCELLANEOUS) ×2 IMPLANT
CAPT HIP TOTAL 3 ×2 IMPLANT
CATH FOL LEG HOLDER (MISCELLANEOUS) ×2 IMPLANT
CATH TRAY METER 16FR LF (MISCELLANEOUS) ×2 IMPLANT
CHLORAPREP W/TINT 26ML (MISCELLANEOUS) ×2 IMPLANT
DRAPE C-ARM XRAY 36X54 (DRAPES) ×2 IMPLANT
DRAPE INCISE IOBAN 66X60 STRL (DRAPES) IMPLANT
DRAPE POUCH INSTRU U-SHP 10X18 (DRAPES) ×2 IMPLANT
DRAPE SHEET LG 3/4 BI-LAMINATE (DRAPES) ×6 IMPLANT
DRAPE STERI IOBAN 125X83 (DRAPES) ×2 IMPLANT
DRAPE TABLE BACK 80X90 (DRAPES) ×2 IMPLANT
DRSG OPSITE POSTOP 4X8 (GAUZE/BANDAGES/DRESSINGS) ×4 IMPLANT
ELECT BLADE 6.5 EXT (BLADE) ×2 IMPLANT
ELECT REM PT RETURN 9FT ADLT (ELECTROSURGICAL) ×2
ELECTRODE REM PT RTRN 9FT ADLT (ELECTROSURGICAL) ×1 IMPLANT
GAUZE SPONGE 4X4 12PLY STRL (GAUZE/BANDAGES/DRESSINGS) ×2 IMPLANT
GLOVE BIOGEL M 7.0 STRL (GLOVE) ×4 IMPLANT
GLOVE BIOGEL PI IND STRL 7.5 (GLOVE) ×2 IMPLANT
GLOVE BIOGEL PI IND STRL 9 (GLOVE) ×2 IMPLANT
GLOVE BIOGEL PI INDICATOR 7.5 (GLOVE) ×2
GLOVE BIOGEL PI INDICATOR 9 (GLOVE) ×2
GLOVE SURG SYN 9.0  PF PI (GLOVE) ×4
GLOVE SURG SYN 9.0 PF PI (GLOVE) ×4 IMPLANT
GOWN SRG 2XL LVL 4 RGLN SLV (GOWNS) ×2 IMPLANT
GOWN STRL NON-REIN 2XL LVL4 (GOWNS) ×2
GOWN STRL REUS W/ TWL LRG LVL3 (GOWN DISPOSABLE) ×1 IMPLANT
GOWN STRL REUS W/TWL LRG LVL3 (GOWN DISPOSABLE) ×1
HEMOVAC 400CC 10FR (MISCELLANEOUS) IMPLANT
HOOD PEEL AWAY FLYTE STAYCOOL (MISCELLANEOUS) ×2 IMPLANT
MAT BLUE FLOOR 46X72 FLO (MISCELLANEOUS) ×2 IMPLANT
NDL SAFETY 18GX1.5 (NEEDLE) ×2 IMPLANT
NEEDLE SPNL 18GX3.5 QUINCKE PK (NEEDLE) ×2 IMPLANT
NS IRRIG 1000ML POUR BTL (IV SOLUTION) ×2 IMPLANT
PACK HIP COMPR (MISCELLANEOUS) ×2 IMPLANT
SOL PREP PVP 2OZ (MISCELLANEOUS) ×2
SOLUTION PREP PVP 2OZ (MISCELLANEOUS) ×1 IMPLANT
STAPLER SKIN PROX 35W (STAPLE) ×2 IMPLANT
STRAP SAFETY BODY (MISCELLANEOUS) ×2 IMPLANT
SUT DVC 2 QUILL PDO  T11 36X36 (SUTURE) ×1
SUT DVC 2 QUILL PDO T11 36X36 (SUTURE) ×1 IMPLANT
SUT DVC QUILL MONODERM 30X30 (SUTURE) ×2 IMPLANT
SUT SILK 0 (SUTURE) ×1
SUT SILK 0 30XBRD TIE 6 (SUTURE) ×1 IMPLANT
SUT VIC AB 1 CT1 36 (SUTURE) ×2 IMPLANT
SYR 20CC LL (SYRINGE) ×2 IMPLANT
SYR 30ML LL (SYRINGE) ×2 IMPLANT
TAPE MICROFOAM 4IN (TAPE) ×2 IMPLANT
TOWEL OR 17X26 4PK STRL BLUE (TOWEL DISPOSABLE) ×2 IMPLANT
TUBE KAMVAC SUCTION (TUBING) IMPLANT

## 2015-12-28 NOTE — Anesthesia Procedure Notes (Signed)
Spinal  Patient location during procedure: OR Start time: 12/28/2015 2:35 PM End time: 12/28/2015 2:43 PM Staffing Anesthesiologist: Andria Frames Resident/CRNA: Nelda Marseille Performed: anesthesiologist and resident/CRNA  Preanesthetic Checklist Completed: patient identified, site marked, surgical consent, pre-op evaluation, timeout performed, IV checked, risks and benefits discussed and monitors and equipment checked Spinal Block Patient position: sitting Prep: ChloraPrep Patient monitoring: heart rate, continuous pulse ox, blood pressure and cardiac monitor Approach: midline Location: L4-5 Injection technique: single-shot Needle Needle type: Whitacre and Introducer  Needle gauge: 24 G Needle length: 9 cm Assessment Sensory level: T10 Additional Notes First attempts by CRNA, Last by MD.  Negative paresthesia. Negative blood return. Positive free-flowing CSF. Expiration date of kit checked and confirmed. Patient tolerated procedure well, without complications.

## 2015-12-28 NOTE — Op Note (Signed)
12/28/2015  4:36 PM  PATIENT:  Charles Austin  60 y.o. male  PRE-OPERATIVE DIAGNOSIS:  PRIMARY OSTEOARTHRITIS HIP  POST-OPERATIVE DIAGNOSIS:  PRIMARY OSTEOARTHRITIS HIP  PROCEDURE:  Procedure(s): TOTAL HIP ARTHROPLASTY ANTERIOR APPROACH (Right)  SURGEON: Leitha SchullerMichael J Raphael Fitzpatrick, MD  ASSISTANTS: None  ANESTHESIA:   spinal  EBL:  Total I/O In: -  Out: 300 [Urine:200; Blood:100]  BLOOD ADMINISTERED:none  DRAINS: none   LOCAL MEDICATIONS USED:  MARCAINE     SPECIMEN:  Source of Specimen:  Right femoral head  DISPOSITION OF SPECIMEN:  PATHOLOGY  COUNTS:  YES  TOURNIQUET:  * No tourniquets in log *  IMPLANTS: Medacta AMIS 5 standard stem with 58 mm Mpact DM liner and cup with L 28 mm head  DICTATION: .Dragon Dictation   The patient was brought to the operating room and after spinal anesthesia was obtained patient was placed on the operative table with the ipsilateral foot into the Medacta attachment, contralateral leg on a well-padded table. C-arm was brought in and preop template x-ray taken. After prepping and draping in usual sterile fashion appropriate patient identification and timeout procedures were completed. Anterior approach to the hip was obtained and centered over the greater trochanter and TFL muscle. The subcutaneous tissue was incised hemostasis being achieved by electrocautery. TFL fascia was incised and the muscle retracted laterally deep retractor placed. The lateral femoral circumflex vessels were identified and ligated. The anterior capsule was exposed and a capsulotomy performed. The neck was identified and a femoral neck cut carried out with a saw. The head was removed without difficulty and showed sclerotic femoral head and acetabulum. Reaming was carried out to 58 mm and a 58 mm cup trial gave appropriate tightness to the acetabular component a 58 DM cup was impacted into position. The leg was then externally rotated and ischiofemoral and pubofemoral releases carried  out. The femur was sequentially broached to a size 5 standard, size 5 standard width L head trials were placed and the final components chosen. The 5 standard stem was inserted along with a L 28 mm head and 58 mm liner. The hip was reduced and was stable the wound was thoroughly irrigated with a dilute Betadine solution. The deep fascia was closed using a heavy Quill after infiltration of 30 cc of quarter percent Sensorcaine with epinephrine. 3-0 V-loc subcutaneous closure with skin staples and honeycomb dressing was Xeroform  PLAN OF CARE: Admit to inpatient

## 2015-12-28 NOTE — Anesthesia Preprocedure Evaluation (Signed)
Anesthesia Evaluation  Patient identified by MRN, date of birth, ID band Patient awake    Reviewed: Allergy & Precautions, H&P , NPO status , Patient's Chart, lab work & pertinent test results  Airway Mallampati: II  TM Distance: >3 FB Neck ROM: limited    Dental no notable dental hx. (+) Poor Dentition, Chipped   Pulmonary neg shortness of breath, COPD, Current Smoker,    Pulmonary exam normal breath sounds clear to auscultation       Cardiovascular Exercise Tolerance: Good (-) angina(-) Past MI and (-) DOE negative cardio ROS Normal cardiovascular exam Rhythm:regular Rate:Normal     Neuro/Psych negative neurological ROS  negative psych ROS   GI/Hepatic negative GI ROS, Neg liver ROS, neg GERD  ,  Endo/Other  negative endocrine ROS  Renal/GU      Musculoskeletal  (+) Arthritis ,   Abdominal   Peds  Hematology negative hematology ROS (+)   Anesthesia Other Findings Past Medical History: 2017: Osteoarthritis of right hip  History reviewed. No pertinent surgical history.     Reproductive/Obstetrics negative OB ROS                             Anesthesia Physical Anesthesia Plan  ASA: III  Anesthesia Plan: Spinal   Post-op Pain Management:    Induction:   Airway Management Planned:   Additional Equipment:   Intra-op Plan:   Post-operative Plan:   Informed Consent: I have reviewed the patients History and Physical, chart, labs and discussed the procedure including the risks, benefits and alternatives for the proposed anesthesia with the patient or authorized representative who has indicated his/her understanding and acceptance.   Dental Advisory Given  Plan Discussed with: Anesthesiologist, CRNA and Surgeon  Anesthesia Plan Comments:         Anesthesia Quick Evaluation

## 2015-12-28 NOTE — Anesthesia Procedure Notes (Signed)
Date/Time: 12/28/2015 3:13 PM Performed by: Junious SilkNOLES, Keyshawna Prouse Pre-anesthesia Checklist: Patient identified, Emergency Drugs available, Suction available, Patient being monitored and Timeout performed Oxygen Delivery Method: Simple face mask

## 2015-12-28 NOTE — Transfer of Care (Signed)
Immediate Anesthesia Transfer of Care Note  Patient: Pricilla Holmichard L Bays  Procedure(s) Performed: Procedure(s): TOTAL HIP ARTHROPLASTY ANTERIOR APPROACH (Right)  Patient Location: PACU  Anesthesia Type:Spinal  Level of Consciousness: sedated  Airway & Oxygen Therapy: Patient Spontanous Breathing and Patient connected to face mask oxygen  Post-op Assessment: Report given to RN and Post -op Vital signs reviewed and stable  Post vital signs: Reviewed and stable  Last Vitals:  Vitals:   12/28/15 1234  BP: (!) 176/93  Pulse: 87  Resp: 18  Temp: 36.7 C    Last Pain:  Vitals:   12/28/15 1234  TempSrc: Oral  PainSc: 4          Complications: No apparent anesthesia complications

## 2015-12-28 NOTE — Progress Notes (Signed)
Laren Evertsosemary D. RN called from OR to advise Dr. Rosita KeaMenz States to start the vanc.

## 2015-12-28 NOTE — H&P (Signed)
      Chief Complaint  Patient presents with  . Hip Pain  Right.   History of the Present Illness: Charles HolmRichard L Hernandez is a 60 y.o. male here for evaluation of right hip pain. His pain score is 10/10. He has not had any prior hip surgery. He had X-rays and CT [facility] that showed advanced osteoarthritis. 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 He localizes pain to the side of the right hip. He has had this pain for several years, which was recently aggravated by a fall.   I have reviewed past medical, surgical, social and family history, and allergies as documented in the EMR.  Past Medical History: History reviewed. No pertinent past medical history.  Past Surgical History: Past Surgical History:  Procedure Laterality Date  . no past surgery   Past Family History: Family History  Problem Relation Age of Onset  . Diabetes type II Mother  . Diabetes type II Father   Medications: No current Epic-ordered outpatient prescriptions on file.   No current Epic-ordered facility-administered medications on file.   Allergies: No Known Allergies   Body mass index is 34.67 kg/(m^2).  Review of Systems: A comprehensive 14 point ROS was performed, reviewed, and the pertinent orthopaedic findings are documented in the HPI.  Vitals:  11/05/15 1040  BP: 140/80   General Physical Examination:  General/Constitutional: No apparent distress: well-nourished and well developed. Eyes: Pupils equal, round with synchronous movement. Lungs: Clear to auscultation HEENT: Normal Vascular: No edema, swelling or tenderness, except as noted in detailed exam. Cardiac: Heart rate and rhythm is regular. Integumentary: No impressive skin lesions present, except as noted in detailed exam. Neuro/Psych: Normal mood and affect, oriented to person, place and time.  Musculoskeletal Examination: Examination demonstrates 20 degrees internal  rotation, 20 degrees external on the right. On the left, there is 20 degrees internal and 40 degrees external. 10 degrees of flexion contracture. Very antalgic gait, right worse than left. Lungs are clear. Heart rate and rhythm is normal. HEENT is normal.  Radiographs: X-rays demonstrate extensive osteophyte formation on the femoral head, especially centrally. There are subchondral cysts within the femoral head.   X-ray was performed 02/02/2015 from the hospital demonstrates degenerative osteoarthritis without fracture.  Assessment: ICD-10-CM ICD-9-CM  1. Primary osteoarthritis of one hip M16.10 715.15   Plan: The patient has clinical findings of severe osteoarthritis right hip with significant progression over the past few years based [facility]. My recommendation is total right hip replacement.   The nature of the condition and the proposed procedure has been reviewed in detail with the patient. Surgical versus non-surgical options and prognosis for recovery have been reviewed and the inherent risks and benefits of each have been discussed including the risks of infection, bleeding, injury to nerves / blood vessels / tendons, incomplete relief of symptoms, persisting pain and / or stiffness, loss of function, complex regional pain syndrome, failure of procedure, as appropriate.  Admit for total hip replacement, right

## 2015-12-29 ENCOUNTER — Encounter: Payer: Self-pay | Admitting: Orthopedic Surgery

## 2015-12-29 LAB — CBC
HCT: 39.4 % — ABNORMAL LOW (ref 40.0–52.0)
HEMOGLOBIN: 12.7 g/dL — AB (ref 13.0–18.0)
MCH: 24.5 pg — AB (ref 26.0–34.0)
MCHC: 32.2 g/dL (ref 32.0–36.0)
MCV: 76 fL — ABNORMAL LOW (ref 80.0–100.0)
PLATELETS: 215 10*3/uL (ref 150–440)
RBC: 5.19 MIL/uL (ref 4.40–5.90)
RDW: 15.5 % — ABNORMAL HIGH (ref 11.5–14.5)
WBC: 10.5 10*3/uL (ref 3.8–10.6)

## 2015-12-29 LAB — BASIC METABOLIC PANEL
ANION GAP: 6 (ref 5–15)
BUN: 14 mg/dL (ref 6–20)
CALCIUM: 8.5 mg/dL — AB (ref 8.9–10.3)
CO2: 22 mmol/L (ref 22–32)
CREATININE: 1.08 mg/dL (ref 0.61–1.24)
Chloride: 108 mmol/L (ref 101–111)
Glucose, Bld: 220 mg/dL — ABNORMAL HIGH (ref 65–99)
Potassium: 4.3 mmol/L (ref 3.5–5.1)
SODIUM: 136 mmol/L (ref 135–145)

## 2015-12-29 NOTE — Anesthesia Postprocedure Evaluation (Signed)
Anesthesia Post Note  Patient: Pricilla HolmRichard L Janssen  Procedure(s) Performed: Procedure(s) (LRB): TOTAL HIP ARTHROPLASTY ANTERIOR APPROACH (Right)  Patient location during evaluation: Nursing Unit Anesthesia Type: Spinal Level of consciousness: awake, awake and alert and oriented Pain management: pain level controlled Vital Signs Assessment: post-procedure vital signs reviewed and stable Respiratory status: spontaneous breathing, nonlabored ventilation and respiratory function stable Cardiovascular status: stable Postop Assessment: no headache Anesthetic complications: no    Last Vitals:  Vitals:   12/28/15 2339 12/29/15 0333  BP: 131/69 (!) 151/72  Pulse: 69 70  Resp: 19 19  Temp: 36.9 C 36.8 C    Last Pain:  Vitals:   12/29/15 0334  TempSrc:   PainSc: 8                  Nolie Bignell,  Alejandria Wessells R

## 2015-12-29 NOTE — Progress Notes (Signed)
Physical Therapy Treatment Patient Details Name: Charles HolmRichard L Austin MRN: [id number] DOB: 03/12/1955 Today's Date: 12/29/2015    History of Present Illness Pt. is a 60 y.o. Male who was [facility] for an elective anterior Approach THA.    PT Comments    Pt presented with improved activity tolerance and was able to amb 220' with CGA/SBA with RW and verbal cues for step-to gait secondary to R knee buckling during turn and initial reciprocal step pattern.  Pt steady with step-to pattern without LOB or further R knee buckling.  Bed mobility remains mod I with need for rail and trapeze bar secondary to R hip flex weakness although pt able to perform RLE seated hip flex 2 x 10 reps at end of session.  Pt progressing well overall and will benefit from HHPT upon discharge to continue to address deficits in strength, mobility, and gait.   Follow Up Recommendations  Home health PT     Equipment Recommendations  Rolling walker with 5" wheels    Recommendations for Other Services       Precautions / Restrictions Precautions Precautions: Anterior Hip Precaution Booklet Issued: Yes (comment) Restrictions Weight Bearing Restrictions: Yes RLE Weight Bearing: Weight bearing as tolerated    Mobility  Bed Mobility Overal bed mobility: Modified Independent             General bed mobility comments: Mod I with use of rail and trapeze bar  Transfers Overall transfer level: Needs assistance Equipment used: Rolling walker (2 wheeled) Transfers: Sit to/from Stand Sit to Stand: Supervision         General transfer comment: Min verbal cues for sequencing  Ambulation/Gait Ambulation/Gait assistance: Supervision;Min guard Ambulation Distance (Feet): 220 Feet Assistive device: Rolling walker (2 wheeled) Gait Pattern/deviations: Step-to pattern;Trunk flexed   Gait velocity interpretation: Below normal speed for age/gender General Gait Details: Pt impulsive with gait and had instance of  R knee buckling during turn to begin amb, step-to gait education provided with significant improvement in stability and decrease in R hip pain   Stairs            Wheelchair Mobility    Modified Rankin (Stroke Patients Only)       Balance Overall balance assessment: Needs assistance   Sitting balance-Leahy Scale: Normal     Standing balance support: No upper extremity supported Standing balance-Leahy Scale: Good Standing balance comment: Improved stability in standing without UE support                    Cognition Arousal/Alertness: Awake/alert Behavior During Therapy: WFL for tasks assessed/performed Overall Cognitive Status: Within Functional Limits for tasks assessed                      Exercises Total Joint Exercises Ankle Circles/Pumps: Strengthening;Both;10 reps Quad Sets: AROM;10 reps;Right Long Arc Quad: Strengthening;Both;10 reps Knee Flexion: Strengthening;Both;10 reps Other Exercises Other Exercises: Seated B hip flex 2 x 10    General Comments        Pertinent Vitals/Pain Pain Assessment: 0-10 Pain Score: 6  Pain Location: R hip Pain Descriptors / Indicators: Sore Pain Intervention(s): Premedicated before session;Monitored during session    Home Living                      Prior Function            PT Goals (current goals can now be found in the care plan section) Acute  Rehab PT Goals Patient Stated Goal: To go back to work PT Goal Formulation: With patient Time For Goal Achievement: 01/11/16 Potential to Achieve Goals: Good Progress towards PT goals: Progressing toward goals    Frequency    BID      PT Plan Current plan remains appropriate    Co-evaluation             End of Session Equipment Utilized During Treatment: Gait belt Activity Tolerance: Patient tolerated treatment well Patient left: in chair;with chair alarm set;with SCD's reapplied;with call bell/phone within reach     Time:  1300-1332 PT Time Calculation (min) (ACUTE ONLY): 32 min  Charges:  $Gait Training: 8-22 mins $Therapeutic Exercise: 8-22 mins                    G Codes:      Elly Modena. Charles Austin PT, DPT 12/29/15, 2:57 PM

## 2015-12-29 NOTE — Evaluation (Signed)
Occupational Therapy Evaluation Patient Details Name: Charles Austin MRN: [id number] DOB: 02/03/1955 Today's Date: 12/29/2015    History of Present Illness Pt. is a 60 y.o. Male who was [facility] for an elective anterior Approach THA.   Clinical Impression   Pt. Is a 60 y.o. Male who was admitted to [facility] for an elective THA. Pt. had an Anterior Approach. Pt. Presents with weakness, pain, and decreased functional mobility which hinder his ability to complete ADLs. Pt. Could benefit from skilled OT services for ADL training, functional mobility, A/E use, and pt. education about home modifications,and DME. Pt. Plans to return home with wife assist.     Follow Up Recommendations  No OT follow up    Equipment Recommendations       Recommendations for Other Services       Precautions / Restrictions Precautions Precautions: Anterior Hip Precaution Booklet Issued: Yes (comment) Restrictions Weight Bearing Restrictions: Yes RLE Weight Bearing: Weight bearing as tolerated      Mobility Bed Mobility Overal bed mobility: Modified Independent             General bed mobility comments: Mod I with use of rail and trapeze bar  Transfers Overall transfer level: Needs assistance Equipment used: Rolling walker (2 wheeled) Transfers: Sit to/from Stand Sit to Stand: Min guard         General transfer comment: Min verbal cues for sequencing    Balance Overall balance assessment: Needs assistance   Sitting balance-Leahy Scale: Normal     Standing balance support: Bilateral upper extremity supported Standing balance-Leahy Scale: Fair Standing balance comment: Pt leans to L during standing activities to avoid WB through RLE                            ADL                                         General ADL Comments: Pt. education was provided about A/E use for LE ADLs.      Vision     Perception     Praxis      Pertinent  Vitals/Pain Pain Assessment: 0-10 Pain Score: 6  Pain Location: Right Hip Pain Descriptors / Indicators: Aching Pain Intervention(s): Limited activity within patient's tolerance     Hand Dominance Right   Extremity/Trunk Assessment Upper Extremity Assessment Upper Extremity Assessment: Overall WFL for tasks assessed          Communication Communication Communication: No difficulties   Cognition Arousal/Alertness: Awake/alert Behavior During Therapy: WFL for tasks assessed/performed Overall Cognitive Status: Within Functional Limits for tasks assessed                     General Comments       Exercises   Shoulder Instructions      Home Living Family/patient expects to be discharged to:: Private residence Living Arrangements: Spouse/significant other Available Help at Discharge: Family Type of Home: Apartment Home Access: Level entry     Home Layout: One level               Home Equipment: None          Prior Functioning/Environment Level of Independence: Independent        Comments: Independent with ADLS, IADLs, working in Aeronautical engineerlandscaping.  OT Problem List: Decreased strength;Pain;Decreased activity tolerance;Decreased knowledge of use of DME or AE   OT Treatment/Interventions: Self-care/ADL training;Therapeutic exercise;Therapeutic activities;DME and/or AE instruction;Patient/family education    OT Goals(Current goals can be found in the care plan section) Acute Rehab OT Goals Patient Stated Goal: To return home OT Goal Formulation: With patient Potential to Achieve Goals: Good  OT Frequency: Min 1X/week   Barriers to D/C:            Co-evaluation              End of Session    Activity Tolerance: Patient tolerated treatment well Patient left: in bed;with call bell/phone within reach;with bed alarm set   Time: 1000-1023 OT Time Calculation (min): 23 min Charges:  OT General Charges $OT Visit: 1 Procedure OT  Evaluation $OT Eval Moderate Complexity: 1 Procedure G-Codes:    Olegario MessierElaine Torrance Stockley, MS, OTR/L 12/29/2015, 10:52 AM

## 2015-12-29 NOTE — Progress Notes (Signed)
Inpatient Diabetes Program Recommendations  AACE/ADA: New Consensus Statement on Inpatient Glycemic Control (2015)  Target Ranges:  Prepandial:   less than 140 mg/dL      Peak postprandial:   less than 180 mg/dL (1-2 hours)      Critically ill patients:  140 - 180 mg/dL   Results for Charles Austin, Charles Austin (MRN [id number]) as of 12/29/2015 09:15  Ref. Range 12/22/2015 14:24 12/29/2015 04:24  Glucose Latest Ref Range: 65 - 99 mg/dL 829159 (H) 562220 (H)   Review of Glycemic Control  Diabetes history: No Outpatient Diabetes medications: NA Current orders for Inpatient glycemic control: None  Inpatient Diabetes Program Recommendations: Correction (SSI): While inpatient, please consider ordering CBGs with Novolog correction scale. HgbA1C: Please consider ordering an A1C (add on to blood in lab if available) to evaluate glycemic control over the past 2-3 months.  Thanks, Orlando PennerMarie Sahiti Joswick, RN, MSN, CDE Diabetes Coordinator Inpatient Diabetes Program [phone] (Team Pager from 8am to 5pm)

## 2015-12-29 NOTE — Evaluation (Signed)
Physical Therapy Evaluation Patient Details Name: Pricilla HolmRichard L Estill MRN: [id number] DOB: 02/12/1955 Today's Date: 12/29/2015   History of Present Illness  Pt. is a 60 y.o. Male who was [facility] for an elective anterior Approach THA.  Clinical Impression  Pt presents with mild deficits in strength, transfers, mobility, gait, balance, and activity tolerance.  Pt was Mod I with bed mobility tasks with use of rail, trapeze bar, and LLE to assist RLE in/out of bed.  Pt CGA with transfers and gait and was able to amb 30' with a RW.  Pt was somewhat impulsive and required verbal cues for safe sequencing with RW.  Pt will benefit from PT services to address above deficits for decreased caregiver assistance upon discharge.      Follow Up Recommendations Home health PT    Equipment Recommendations  Rolling walker with 5" wheels (Pt 6'3" 270 lbs)    Recommendations for Other Services       Precautions / Restrictions Precautions Precautions: Anterior Hip Precaution Booklet Issued: Yes (comment) Restrictions Weight Bearing Restrictions: Yes RLE Weight Bearing: Weight bearing as tolerated      Mobility  Bed Mobility Overal bed mobility: Modified Independent             General bed mobility comments: Mod I with use of rail and trapeze bar  Transfers Overall transfer level: Needs assistance Equipment used: Rolling walker (2 wheeled) Transfers: Sit to/from Stand Sit to Stand: Min guard         General transfer comment: Min verbal cues for sequencing  Ambulation/Gait Ambulation/Gait assistance: Min guard Ambulation Distance (Feet): 30 Feet Assistive device: Rolling walker (2 wheeled) Gait Pattern/deviations: Step-through pattern;Decreased step length - left;Antalgic   Gait velocity interpretation: Below normal speed for age/gender    Stairs            Wheelchair Mobility    Modified Rankin (Stroke Patients Only)       Balance Overall balance assessment:  Needs assistance   Sitting balance-Leahy Scale: Normal     Standing balance support: Bilateral upper extremity supported Standing balance-Leahy Scale: Fair Standing balance comment: Pt leans to L during standing activities to avoid WB through RLE                             Pertinent Vitals/Pain Pain Assessment: 0-10 Pain Score: 6  Pain Location: Right Hip Pain Descriptors / Indicators: Aching Pain Intervention(s): Limited activity within patient's tolerance    Home Living Family/patient expects to be discharged to:: Private residence Living Arrangements: Spouse/significant other Available Help at Discharge: Family Type of Home: Apartment Home Access: Level entry     Home Layout: One level Home Equipment: None      Prior Function Level of Independence: Independent         Comments: Independent with ADLS, IADLs, working in [employer].     Hand Dominance   Dominant Hand: Right    Extremity/Trunk Assessment   Upper Extremity Assessment: Overall WFL for tasks assessed           Lower Extremity Assessment: Generalized weakness;RLE deficits/detail RLE Deficits / Details: R hip flex grossly 3/5 but limited by pain, R knee flex/ext 4/5 but limited by pain, R ankle DF WFL       Communication   Communication: No difficulties  Cognition Arousal/Alertness: Awake/alert Behavior During Therapy: WFL for tasks assessed/performed Overall Cognitive Status: Within Functional Limits for tasks assessed  General Comments      Exercises Total Joint Exercises Ankle Circles/Pumps: AROM;Both;10 reps;15 reps Quad Sets: AROM;Both;10 reps;15 reps Gluteal Sets: AROM;Both;10 reps Heel Slides: AAROM;Right;5 reps;10 reps Hip ABduction/ADduction: Other (comment) (Isometric R hip abd x 10) Straight Leg Raises: AAROM;Right;10 reps Long Arc Quad: AROM;Right;10 reps Knee Flexion: AROM;Right;10 reps Marching in Standing: AROM;Both;10  reps Other Exercises Other Exercises: RLE leg press with manual resistance x 10   Assessment/Plan    PT Assessment Patient needs continued PT services  PT Problem List Decreased strength;Decreased activity tolerance;Decreased balance;Decreased mobility;Decreased knowledge of use of DME          PT Treatment Interventions DME instruction;Gait training;Stair training;Functional mobility training;Therapeutic activities;Therapeutic exercise;Balance training;Neuromuscular re-education;Patient/family education    PT Goals (Current goals can be found in the Care Plan section)  Acute Rehab PT Goals Patient Stated Goal: To go back to work PT Goal Formulation: With patient Time For Goal Achievement: 01/11/16 Potential to Achieve Goals: Good    Frequency BID   Barriers to discharge        Co-evaluation               End of Session Equipment Utilized During Treatment: Gait belt Activity Tolerance: Patient tolerated treatment well (Decreased R hip pain with activity compared to baseline) Patient left: in bed;with bed alarm set;with SCD's reapplied;with call bell/phone within reach;with family/visitor present           Time: 0850-0920 PT Time Calculation (min) (ACUTE ONLY): 30 min   Charges:   PT Evaluation $PT Eval Low Complexity: 1 Procedure PT Treatments $Therapeutic Exercise: 8-22 mins   PT G Codes:        Elly Modena. Scott Maximilien Hayashi PT, DPT 12/29/15, 10:58 AM

## 2015-12-29 NOTE — Progress Notes (Signed)
Clinical Social Worker (CSW) received SNF consult. PT is recommending home health. RN case manager is aware of above. Please reconsult if future social work needs arise. CSW signing off.   Shajuana Mclucas, LCSW [phone] 

## 2015-12-29 NOTE — Care Management Note (Signed)
Case Management Note  Patient Details  Name: Charles Austin MRN: [id number] Date of Birth: 1955-12-01  Subjective/Objective:  POD # 1 right THA. RNCM consult for discharge planning. Met with patient at bedside. Prior to admission, he was working. Lives with spouse.  He is agreeable to home health and has no preference. [facility] to [facility]. Uses [facility] [address] [phone]. Called Lovenox 40 mg,    # 14 no refills. Walker ordered from [facility].                Action/Plan: Lovenox called in, Walker ordered from [facility]. [facility]  Expected Discharge Date:                  Expected Discharge Plan:  [place]  In-House Referral:     Discharge planning Services  CM Consult  Post Acute Care Choice:  Durable Medical Equipment, Home Health Choice offered to:  Patient  DME Arranged:  Walker rolling DME Agency:  [place]:  PT [place]:  [facility]  Status of Service:  In process, will continue to follow  If discussed at Long Length of Stay Meetings, dates discussed:    Additional Comments:  Jolly Mango, RN 12/29/2015, 11:30 AM

## 2015-12-30 LAB — SURGICAL PATHOLOGY

## 2015-12-30 MED ORDER — OXYCODONE HCL 5 MG PO TABS: 5.0000 mg | ORAL_TABLET | ORAL | 0 refills | Status: DC | PRN

## 2015-12-30 MED ORDER — ENOXAPARIN SODIUM 40 MG/0.4ML ~~LOC~~ SOLN: 40.0000 mg | SUBCUTANEOUS | 0 refills | Status: DC

## 2015-12-30 NOTE — Plan of Care (Signed)
Problem: Bowel/Gastric: Goal: Will not experience complications related to bowel motility Outcome: Progressing Pt is progressing toward goals.   

## 2015-12-30 NOTE — Progress Notes (Signed)
Physical Therapy Treatment Patient Details Name: Charles HolmRichard L Riva MRN: [id number] DOB: 02/04/1955 Today's Date: 12/30/2015    History of Present Illness Pt. is a 60 y.o. Male who was [facility] to [facility] for an elective anterior Approach THA.    PT Comments    Pt in bed, agrees to session.  Stated he needed to use bathroom upon sitting and ambulated quickly with poor safety to bathroom resisting re-direction to wait for gait belt.  Knees buckled x 1 but he was able to recover without assist.  Educated on safety.  After voiding, he was able to ambulate 120' with walker and min guard with improved gait quality.  Participated in exercises as described below.  Pt returned to bed despite encouragement to sit in the recliner for a while today stating he would do it later.  Pt seems to self-limit at times with minimal ROM with exercises.    Follow Up Recommendations  Home health PT     Equipment Recommendations  Rolling walker with 5" wheels    Recommendations for Other Services       Precautions / Restrictions Precautions Precautions: Anterior Hip Restrictions Weight Bearing Restrictions: Yes RLE Weight Bearing: Weight bearing as tolerated    Mobility  Bed Mobility Overal bed mobility: Needs Assistance Bed Mobility: Supine to Sit;Sit to Supine     Supine to sit: Modified independent (Device/Increase time) Sit to supine: Min assist (needed assist this pm for le management)      Transfers Overall transfer level: Needs assistance Equipment used: Rolling walker (2 wheeled) Transfers: Sit to/from Stand Sit to Stand: Min guard         General transfer comment: Min verbal cues for sequencing  Ambulation/Gait Ambulation/Gait assistance: Min guard;Supervision Ambulation Distance (Feet): 120 Feet Assistive device: Rolling walker (2 wheeled) Gait Pattern/deviations: Step-to pattern (irregular step pattern)   Gait velocity interpretation: Below normal speed for age/gender      Stairs            Wheelchair Mobility    Modified Rankin (Stroke Patients Only)       Balance Overall balance assessment: Needs assistance Sitting-balance support: Feet supported Sitting balance-Leahy Scale: Normal     Standing balance support: No upper extremity supported Standing balance-Leahy Scale: Fair                      Cognition Arousal/Alertness: Awake/alert Behavior During Therapy: WFL for tasks assessed/performed Overall Cognitive Status: Within Functional Limits for tasks assessed                      Exercises Total Joint Exercises Ankle Circles/Pumps: Strengthening;Both;10 reps Quad Sets: AROM;10 reps;Right Gluteal Sets: AROM;Both;10 reps Heel Slides: AAROM;Right;5 reps;10 reps Straight Leg Raises: AAROM;Right;10 reps Long Arc Quad: Strengthening;Both;10 reps Marching in Standing: AROM;Both;10 reps    General Comments        Pertinent Vitals/Pain Pain Assessment: 0-10 Pain Score: 7  Pain Location: R hip Pain Descriptors / Indicators: Sore;Constant;Operative site guarding Pain Intervention(s): Limited activity within patient's tolerance    Home Living                      Prior Function            PT Goals (current goals can now be found in the care plan section) Acute Rehab PT Goals Patient Stated Goal: To go back to work Progress towards PT goals: Progressing toward goals    Frequency  BID      PT Plan Current plan remains appropriate    Co-evaluation             End of Session Equipment Utilized During Treatment: Gait belt Activity Tolerance: Patient tolerated treatment well Patient left: in bed;with call bell/phone within reach;with bed alarm set     Time: [id number] PT Time Calculation (min) (ACUTE ONLY): 14 min  Charges:  $Gait Training: 8-22 mins                    G Codes:      Danielle DessSarah Layken Doenges 12/30/2015, 2:20 PM

## 2015-12-30 NOTE — Discharge Instructions (Signed)
ANTERIOR APPROACH TOTAL HIP REPLACEMENT POSTOPERATIVE DIRECTIONS   Hip Rehabilitation, Guidelines Following Surgery  The results of a hip operation are greatly improved after range of motion and muscle strengthening exercises. Follow all safety measures which are given to protect your hip. If any of these exercises cause increased pain or swelling in your joint, decrease the amount until you are comfortable again. Then slowly increase the exercises. Call your caregiver if you have problems or questions.   HOME CARE INSTRUCTIONS  Remove items at home which could result in a fall. This includes throw rugs or furniture in walking pathways.   ICE to the affected hip every three hours for 30 minutes at a time and then as needed for pain and swelling.  Continue to use ice on the hip for pain and swelling from surgery. You may notice swelling that will progress down to the foot and ankle.  This is normal after surgery.  Elevate the leg when you are not up walking on it.    Continue to use the breathing machine which will help keep your temperature down.  It is common for your temperature to cycle up and down following surgery, especially at night when you are not up moving around and exerting yourself.  The breathing machine keeps your lungs expanded and your temperature down.  Do not place pillow under knee, focus on keeping the knee straight while resting  DIET You may resume your previous home diet once your are discharged from the hospital.  DRESSING / WOUND CARE / SHOWERING You may change your dressing 3-5 days after surgery.  Then change the dressing every day with sterile gauze.  Please use good hand washing techniques before changing the dressing.  Do not use any lotions or creams on the incision until instructed by your surgeon. Keep your dressing dry with showering.  You can keep it covered and pat dry. Change the surgical dressing daily and reapply a dry dressing each time.  ACTIVITY Walk  with your walker as instructed. Use walker as long as suggested by your caregivers. Avoid periods of inactivity such as sitting longer than an hour when not asleep. This helps prevent blood clots.  You may resume a sexual relationship in one month or when given the OK by your doctor.  You may return to work once you are cleared by your doctor.  Do not drive a car for 6 weeks or until released by you surgeon.  Do not drive while taking narcotics.  WEIGHT BEARING Weight bearing as tolerated with assist device (walker, cane, etc) as directed, use it as long as suggested by your surgeon or therapist, typically at least 4-6 weeks.  POSTOPERATIVE CONSTIPATION PROTOCOL Constipation - defined medically as fewer than three stools per week and severe constipation as less than one stool per week.  One of the most common issues patients have following surgery is constipation.  Even if you have a regular bowel pattern at home, your normal regimen is likely to be disrupted due to multiple reasons following surgery.  Combination of anesthesia, postoperative narcotics, change in appetite and fluid intake all can affect your bowels.  In order to avoid complications following surgery, here are some recommendations in order to help you during your recovery period.  Colace (docusate) - Pick up an over-the-counter form of Colace or another stool softener and take twice a day as long as you are requiring postoperative pain medications.  Take with a full glass of water daily.  If   you experience loose stools or diarrhea, hold the colace until you stool forms back up.  If your symptoms do not get better within 1 week or if they get worse, check with your doctor.  Dulcolax (bisacodyl) - Pick up over-the-counter and take as directed by the product packaging as needed to assist with the movement of your bowels.  Take with a full glass of water.  Use this product as needed if not relieved by Colace only.   MiraLax  (polyethylene glycol) - Pick up over-the-counter to have on hand.  MiraLax is a solution that will increase the amount of water in your bowels to assist with bowel movements.  Take as directed and can mix with a glass of water, juice, soda, coffee, or tea.  Take if you go more than two days without a movement. Do not use MiraLax more than once per day. Call your doctor if you are still constipated or irregular after using this medication for 7 days in a row.  If you continue to have problems with postoperative constipation, please contact the office for further assistance and recommendations.  If you experience "the worst abdominal pain ever" or develop nausea or vomiting, please contact the office immediatly for further recommendations for treatment.  ITCHING  If you experience itching with your medications, try taking only a single pain pill, or even half a pain pill at a time.  You can also use Benadryl over the counter for itching or also to help with sleep.   TED HOSE STOCKINGS Wear the elastic stockings on both legs for three weeks following surgery during the day but you may remove then at night for sleeping.  MEDICATIONS See your medication summary on the "After Visit Summary" that the nursing staff will review with you prior to discharge.  You may have some home medications which will be placed on hold until you complete the course of blood thinner medication.  It is important for you to complete the blood thinner medication as prescribed by your surgeon.  Continue your approved medications as instructed at time of discharge.  PRECAUTIONS If you experience chest pain or shortness of breath - call 911 immediately for transfer to the hospital emergency department.  If you develop a fever greater that 101 F, purulent drainage from wound, increased redness or drainage from wound, foul odor from the wound/dressing, or calf pain - CONTACT YOUR SURGEON.                                                    FOLLOW-UP APPOINTMENTS Make sure you keep all of your appointments after your operation with your surgeon and caregivers. You should call the office at the above phone number and make an appointment for approximately two weeks after the date of your surgery or on the date instructed by your surgeon outlined in the "After Visit Summary".  RANGE OF MOTION AND STRENGTHENING EXERCISES  These exercises are designed to help you keep full movement of your hip joint. Follow your caregiver's or physical therapist's instructions. Perform all exercises about fifteen times, three times per day or as directed. Exercise both hips, even if you have had only one joint replacement. These exercises can be done on a training (exercise) mat, on the floor, on a table or on a bed. Use whatever works the best and   is most comfortable for you. Use music or television while you are exercising so that the exercises are a pleasant break in your day. This will make your life better with the exercises acting as a break in routine you can look forward to.  Lying on your back, slowly slide your foot toward your buttocks, raising your knee up off the floor. Then slowly slide your foot back down until your leg is straight again.  Lying on your back spread your legs as far apart as you can without causing discomfort.  Lying on your side, raise your upper leg and foot straight up from the floor as far as is comfortable. Slowly lower the leg and repeat.  Lying on your back, tighten up the muscle in the front of your thigh (quadriceps muscles). You can do this by keeping your leg straight and trying to raise your heel off the floor. This helps strengthen the largest muscle supporting your knee.  Lying on your back, tighten up the muscles of your buttocks both with the legs straight and with the knee bent at a comfortable angle while keeping your heel on the floor.   IF YOU ARE TRANSFERRED TO A SKILLED REHAB FACILITY If the patient is  transferred to a skilled rehab facility following release from the hospital, a list of the current medications will be sent to the facility for the patient to continue.  When discharged from the skilled rehab facility, please have the facility set up the patient's Home Health Physical Therapy prior to being released. Also, the skilled facility will be responsible for providing the patient with their medications at time of release from the facility to include their pain medication, the muscle relaxants, and their blood thinner medication. If the patient is still at the rehab facility at time of the two week follow up appointment, the skilled rehab facility will also need to assist the patient in arranging follow up appointment in our office and any transportation needs.  MAKE SURE YOU:  Understand these instructions.  Get help right away if you are not doing well or get worse.    Pick up stool softner and laxative for home use following surgery while on pain medications. Do not submerge incision under water. Please use good hand washing techniques while changing dressing each day. May shower starting three days after surgery. Please use a clean towel to pat the incision dry following showers. Continue to use ice for pain and swelling after surgery. Do not use any lotions or creams on the incision until instructed by your surgeon. 

## 2015-12-30 NOTE — Progress Notes (Signed)
  Subjective: 2 Days Post-Op Procedure(s) (LRB): TOTAL HIP ARTHROPLASTY ANTERIOR APPROACH (Right) Patient reports pain as moderate.   Patient seen in rounds with Dr. Rosita KeaMenz. Patient is well, and has had no acute complaints or problems Plan is to go Home after hospital stay. Negative for chest pain and shortness of breath Fever: 99.9 Gastrointestinal: Negative for nausea and vomiting  Objective: Vital signs in last 24 hours: Temp:  [98.9 F (37.2 C)-100 F (37.8 C)] 99.9 F (37.7 C) (11/30 0443) Pulse Rate:  [74-86] 86 (11/30 0443) Resp:  [18-20] 18 (11/30 0443) BP: (138-183)/(73-78) 158/73 (11/30 0443) SpO2:  [94 %-97 %] 94 % (11/30 0443)  Intake/Output from previous day:  Intake/Output Summary (Last 24 hours) at 12/30/15 0622 Last data filed at 12/30/15 0445  Gross per 24 hour  Intake             2295 ml  Output             1050 ml  Net             1245 ml    Intake/Output this shift: Total I/O In: 2295 [P.O.:600; I.V.:1695] Out: 650 [Urine:650]  Labs:  Recent Labs  12/28/15 2204 12/29/15 0424  HGB 13.5 12.7*    Recent Labs  12/28/15 2204 12/29/15 0424  WBC 10.6 10.5  RBC 5.49 5.19  HCT 42.1 39.4*  PLT 243 215    Recent Labs  12/28/15 2204 12/29/15 0424  NA  --  136  K  --  4.3  CL  --  108  CO2  --  22  BUN  --  14  CREATININE 1.05 1.08  GLUCOSE  --  220*  CALCIUM  --  8.5*   No results for input(s): LABPT, INR in the last 72 hours.   EXAM General - Patient is Alert and Oriented Extremity - Sensation intact distally Dorsiflexion/Plantar flexion intact No cellulitis present Compartment soft Dressing/Incision - clean, dry, blood tinged drainage, a new dry dressing was applied Motor Function - intact, moving foot and toes well on exam. The patient ambulated 220 feet with physical therapy  Past Medical History:  Diagnosis Date  . Osteoarthritis of right hip 2017    Assessment/Plan: 2 Days Post-Op Procedure(s) (LRB): TOTAL HIP  ARTHROPLASTY ANTERIOR APPROACH (Right) Active Problems:   Primary localized osteoarthritis of right hip  Estimated body mass index is 33.75 kg/m as calculated from the following:   Height as of this encounter: 6\' 3"  (1.905 m).   Weight as of this encounter: 122.5 kg (270 lb). Up with therapy Plan for discharge tomorrow to go home with home health physical therapy  DVT Prophylaxis - Lovenox, Foot Pumps and TED hose Weight-Bearing as tolerated to right leg  Dedra Skeensodd Jessey Stehlin, PA-C Orthopaedic Surgery 12/30/2015, 6:22 AM

## 2015-12-30 NOTE — Progress Notes (Signed)
Shift assessment completed at 0800. Pt is oob to chair at that time, stated that practitioner has rounded and changed dressing to R hip incision. Pt is alert and oriented x3, is on room air, lungs are clear bilat. HR is regular, abdomen is soft, bs heard. Pt is voiding in urina prn, clear yellow urine emptied on assessment. Pt has teds on bilat, legs are elevated, ppp. R upper thigh has incision intact under dressing, dressing is dry and intact. Pt stated he did not have any pain until the dressing was changed, pt received med for pain. PIV #20 intact to r arm, site is free of redness and swelling. Since assessment, pt has walked with the walker and physical therapy, has not required any further pain medication. Pt has been back and forth to recliner, is currently in bed and eating dinner. Call bell in reach.

## 2015-12-30 NOTE — Progress Notes (Signed)
Physical Therapy Treatment Patient Details Name: Charles HolmRichard L Austin MRN: [id number] DOB: 09/01/1955 Today's Date: 12/30/2015    History of Present Illness Pt. is a 60 y.o. Male who was admitted to [facility] for an elective anterior Approach THA.    PT Comments    Pt in bed, agrees to session.  Participated in exercises as described below.  Pt was able to ambulate in hallway 120' with supervision.  He returned to bed per his request stating he was up early this morning and wanted to go back to bed.  Will continue this afternoon.  Pt reports no stairs in/out of home.    Follow Up Recommendations  Home health PT     Equipment Recommendations  Rolling walker with 5" wheels    Recommendations for Other Services       Precautions / Restrictions Precautions Precautions: Anterior Hip Restrictions Weight Bearing Restrictions: Yes RLE Weight Bearing: Weight bearing as tolerated    Mobility  Bed Mobility Overal bed mobility: Modified Independent             General bed mobility comments: no trapeeze use today.  some difficulty due to pt's height and foot board but able to do without assist.  Transfers Overall transfer level: Needs assistance Equipment used: Rolling walker (2 wheeled) Transfers: Sit to/from Stand Sit to Stand: Supervision         General transfer comment: Min verbal cues for sequencing  Ambulation/Gait Ambulation/Gait assistance: Supervision Ambulation Distance (Feet): 120 Feet Assistive device: Rolling walker (2 wheeled) Gait Pattern/deviations: Step-to pattern   Gait velocity interpretation: Below normal speed for age/gender General Gait Details: irregular pattern at times, needs vc's to correct pattern and for walker placement.   Stairs            Wheelchair Mobility    Modified Rankin (Stroke Patients Only)       Balance Overall balance assessment: Needs assistance Sitting-balance support: Feet supported Sitting balance-Leahy Scale:  Normal     Standing balance support: No upper extremity supported Standing balance-Leahy Scale: Good                      Cognition Arousal/Alertness: Awake/alert Behavior During Therapy: WFL for tasks assessed/performed Overall Cognitive Status: Within Functional Limits for tasks assessed                      Exercises Total Joint Exercises Ankle Circles/Pumps: Strengthening;Both;10 reps Quad Sets: AROM;10 reps;Right Gluteal Sets: AROM;Both;10 reps Heel Slides: AAROM;Right;5 reps;10 reps Straight Leg Raises: AAROM;Right;10 reps Long Arc Quad: Strengthening;Both;10 reps    General Comments        Pertinent Vitals/Pain Pain Assessment: 0-10 Pain Score: 7  Pain Location: R hip Pain Descriptors / Indicators: Sore;Operative site guarding Pain Intervention(s): Limited activity within patient's tolerance;Monitored during session    Home Living                      Prior Function            PT Goals (current goals can now be found in the care plan section) Acute Rehab PT Goals Patient Stated Goal: To go back to work Progress towards PT goals: Progressing toward goals    Frequency    BID      PT Plan Current plan remains appropriate    Co-evaluation             End of Session Equipment Utilized During Treatment: Gait belt  Activity Tolerance: Patient tolerated treatment well Patient left: in bed;with call bell/phone within reach;with bed alarm set     Time: [id number] PT Time Calculation (min) (ACUTE ONLY): 13 min  Charges:  $Gait Training: 8-22 mins $Therapeutic Exercise: 8-22 mins                    G Codes:      Danielle DessSarah Gwendy Boeder 12/30/2015, 10:07 AM

## 2015-12-31 NOTE — Discharge Summary (Signed)
Physician Discharge Summary  Subjective: 3 Days Post-Op Procedure(s) (LRB): TOTAL HIP ARTHROPLASTY ANTERIOR APPROACH (Right) Patient reports pain as mild.   Patient seen in rounds with Dr. Rosita KeaMenz. Patient is well, and has had no acute complaints or problems Patient is ready to go Home with home health physical therapy  Physician Discharge Summary  Patient ID: Pricilla HolmRichard L Kugel MRN: [id number] DOB/AGE: 58/07/1955 58 y.o.  Admit date: 12/28/2015 Discharge date: 12/31/2015  Admission Diagnoses:  Discharge Diagnoses:  Active Problems:   Primary localized osteoarthritis of right hip   Discharged Condition: good  Hospital Course: The patient is postop day 3 from a left anterior total hip replacement. The patient has done well with physical therapy. He has ambulated around the nurse's station and is able to transfer in the room independently. The patient is having good pain control. He had no complications while in hospital. The patient has had a bowel movement.  Treatments: surgery:  TOTAL HIP ARTHROPLASTY ANTERIOR APPROACH (Right)  SURGEON: Leitha SchullerMichael J Menz, MD  ASSISTANTS: None  ANESTHESIA:   spinal  EBL:  Total I/O In: -  Out: 300 [Urine:200; Blood:100]  BLOOD ADMINISTERED:none  DRAINS: none   LOCAL MEDICATIONS USED:  MARCAINE     SPECIMEN:  Source of Specimen:  Right femoral head  DISPOSITION OF SPECIMEN:  PATHOLOGY  COUNTS:  YES  TOURNIQUET:  * No tourniquets in log *  IMPLANTS: Medacta AMIS 5 standard stem with 58 mm Mpact DM liner and cup with L 28 mm head  Discharge Exam: Blood pressure (!) 143/65, pulse 83, temperature 98.7 F (37.1 C), temperature source Oral, resp. rate 18, height 6\' 3"  (1.905 m), weight 122.5 kg (270 lb), SpO2 95 %.   Disposition: ED Dismiss - Never Arrived     Medication List    STOP taking these medications   HYDROcodone-acetaminophen 5-325 MG tablet Commonly known as:  NORCO/VICODIN     TAKE these medications    enoxaparin 40 MG/0.4ML injection Commonly known as:  LOVENOX Inject 0.4 mLs (40 mg total) into the skin daily.   oxyCODONE 5 MG immediate release tablet Commonly known as:  Oxy IR/ROXICODONE Take 1-2 tablets (5-10 mg total) by mouth every 3 (three) hours as needed for breakthrough pain.            Durable Medical Equipment        Start     Ordered   12/28/15 1933  DME Bedside commode  Once    Question:  Patient needs a bedside commode to treat with the following condition  Answer:  Status post total hip replacement, right   12/28/15 1932   12/28/15 1933  DME 3 n 1  Once     12/28/15 1932   12/28/15 1933  DME Walker rolling  Once    Question:  Patient needs a walker to treat with the following condition  Answer:  Status post total hip replacement, right   12/28/15 1932     Follow-up Information    MENZ,MICHAEL, MD Follow up in 2 week(s).   Specialty:  Orthopedic Surgery Why:  For staple removal Contact information: [address] [facility] [place] [id number] [phone]           Signed: Lenard ForthMUNDY, Anshul Meddings 12/31/2015, 6:36 AM   Objective: Vital signs in last 24 hours: Temp:  [98.2 F (36.8 C)-100.2 F (37.9 C)] 98.7 F (37.1 C) (12/01 0619) Pulse Rate:  [83-91] 83 (12/01 0432) Resp:  [18-22] 18 (12/01 0432) BP: (  143-148)/(65-77) 143/65 (12/01 0432) SpO2:  [95 %-98 %] 95 % (12/01 0432)  Intake/Output from previous day:  Intake/Output Summary (Last 24 hours) at 12/31/15 0636 Last data filed at 12/31/15 0540  Gross per 24 hour  Intake              600 ml  Output             1025 ml  Net             -425 ml    Intake/Output this shift: Total I/O In: 600 [P.O.:600] Out: 775 [Urine:775]  Labs:  Recent Labs  12/28/15 2204 12/29/15 0424  HGB 13.5 12.7*    Recent Labs  12/28/15 2204 12/29/15 0424  WBC 10.6 10.5  RBC 5.49 5.19  HCT 42.1 39.4*  PLT 243 215    Recent Labs  12/28/15 2204 12/29/15 0424  NA  --   136  K  --  4.3  CL  --  108  CO2  --  22  BUN  --  14  CREATININE 1.05 1.08  GLUCOSE  --  220*  CALCIUM  --  8.5*   No results for input(s): LABPT, INR in the last 72 hours.  EXAM: General - Patient is Alert and Oriented Extremity - Dorsiflexion/Plantar flexion intact No cellulitis present Compartment soft Incision - clean, dry, no drainage Motor Function -  the patient is ambulating with physical therapy. He can plantarflex and dorsiflex his feet.  Assessment/Plan: 3 Days Post-Op Procedure(s) (LRB): TOTAL HIP ARTHROPLASTY ANTERIOR APPROACH (Right) Procedure(s) (LRB): TOTAL HIP ARTHROPLASTY ANTERIOR APPROACH (Right) Past Medical History:  Diagnosis Date  . Osteoarthritis of right hip 2017   Active Problems:   Primary localized osteoarthritis of right hip  Estimated body mass index is 33.75 kg/m as calculated from the following:   Height as of this encounter: 6\' 3"  (1.905 m).   Weight as of this encounter: 122.5 kg (270 lb). Discharge home with home health Diet - Regular diet Follow up - in 2 weeks Activity - WBAT Disposition - Home Condition Upon Discharge - Good DVT Prophylaxis - Lovenox and TED hose  Dedra Skeensodd Nalaysia Manganiello, PA-C Orthopaedic Surgery 12/31/2015, 6:36 AM

## 2015-12-31 NOTE — Care Management Note (Signed)
Case Management Note  Patient Details  Name: Charles Austin MRN: [id number] Date of Birth: 01/04/1956  Subjective/Objective:  [facility] notified of discharge                  Action/Plan:   Expected Discharge Date:   12/31/2015               Expected Discharge Plan:  Home w Home Health Services  In-House Referral:     Discharge planning Services  CM Consult  Post Acute Care Choice:  Durable Medical Equipment, Home Health Choice offered to:  Patient  DME Arranged:  Walker rolling DME Agency:  [facility]  HH Arranged:  PT [facility]  Status of Service:  Completed, signed off  If discussed at Long Length of Stay Meetings, dates discussed:    Additional Comments:  Marily MemosLisa M Santosh Petter, RN 12/31/2015, 8:56 AM

## 2015-12-31 NOTE — Progress Notes (Signed)
Shift assessment completed at 0745. Pt is alert and oriented x4, on room air, lungs are clear bilat. S1S2 heard, abdomen si soft, bs heard. Pt denied difficiul;ty voiding, teds on bilat, ppp. r hip has dressing intact, PIV #20 intact to R arm, site is free of redness and swelling. Pt rating pain at a low level, stated he is ready to go home. Srx2, call bell in reach.

## 2015-12-31 NOTE — Progress Notes (Signed)
Physical Therapy Treatment Patient Details Name: Charles Austin MRN: [id number] DOB: 06/01/1955 Today's Date: 12/31/2015    History of Present Illness Pt. is a 60 y.o. Male who was [facility] for an elective anterior Approach THA.    PT Comments    Pt in chair.  Ambulated around nursing unit with walker and min guard.  Overall improved gait quality and safety today.  Participated in exercises as described below.  HEP reviewed.  No further questions or concerns voiced.   Follow Up Recommendations  Home health PT     Equipment Recommendations  Rolling walker with 5" wheels    Recommendations for Other Services       Precautions / Restrictions Precautions Precautions: Anterior Hip Restrictions Weight Bearing Restrictions: Yes RLE Weight Bearing: Weight bearing as tolerated    Mobility  Bed Mobility                  Transfers Overall transfer level: Needs assistance Equipment used: Rolling walker (2 wheeled) Transfers: Sit to/from Stand Sit to Stand: Min guard            Ambulation/Gait Ambulation/Gait assistance: Min guard Ambulation Distance (Feet): 240 Feet Assistive device: Rolling walker (2 wheeled) Gait Pattern/deviations: Step-to pattern   Gait velocity interpretation: Below normal speed for age/gender General Gait Details: gait improved today with good pattern and safety   Stairs Stairs:  (Pt reports no stairs at home )          Wheelchair Mobility    Modified Rankin (Stroke Patients Only)       Balance Overall balance assessment: Needs assistance Sitting-balance support: Feet supported Sitting balance-Leahy Scale: Normal     Standing balance support: No upper extremity supported Standing balance-Leahy Scale: Fair                      Cognition Arousal/Alertness: Awake/alert Behavior During Therapy: WFL for tasks assessed/performed Overall Cognitive Status: Within Functional Limits for tasks assessed                      Exercises Other Exercises Other Exercises: standing exercises 2 x 10 for RLE - marches, SLR, AB/AD and toe raises    General Comments        Pertinent Vitals/Pain Pain Assessment: 0-10 Pain Score: 6  Pain Location: R hip Pain Descriptors / Indicators: Sore Pain Intervention(s): Limited activity within patient's tolerance    Home Living                      Prior Function            PT Goals (current goals can now be found in the care plan section) Progress towards PT goals: Progressing toward goals    Frequency    BID      PT Plan Current plan remains appropriate    Co-evaluation             End of Session Equipment Utilized During Treatment: Gait belt Activity Tolerance: Patient tolerated treatment well Patient left: in chair;with call bell/phone within reach;with chair alarm set     Time:  -     Charges:  $Gait Training: 8-22 mins                    G Codes:      Danielle DessSarah Zaylah Blecha 12/31/2015, 11:31 AM

## 2015-12-31 NOTE — Progress Notes (Signed)
PIV removed by student nurse with catheter intact, and dressing changed to honeycomb by student nurse as well.small amount of old bloody drainage noted.

## 2015-12-31 NOTE — Plan of Care (Signed)
Problem: Bowel/Gastric: Goal: Will not experience complications related to bowel motility Outcome: Completed/Met Date Met: 12/31/15 Pt has completed all goals for discharge.

## 2015-12-31 NOTE — Progress Notes (Signed)
  Subjective: 3 Days Post-Op Procedure(s) (LRB): TOTAL HIP ARTHROPLASTY ANTERIOR APPROACH (Right) Patient reports pain as mild.   Patient seen in rounds with Dr. Rosita KeaMenz. Patient is well, and has had no acute complaints or problems Plan is to go Home after hospital stay. Negative for chest pain and shortness of breath Fever: No fever Gastrointestinal: Negative for nausea and vomiting  Objective: Vital signs in last 24 hours: Temp:  [98.2 F (36.8 C)-100.2 F (37.9 C)] 98.7 F (37.1 C) (12/01 0619) Pulse Rate:  [83-91] 83 (12/01 0432) Resp:  [18-22] 18 (12/01 0432) BP: (143-148)/(65-77) 143/65 (12/01 0432) SpO2:  [95 %-98 %] 95 % (12/01 0432)  Intake/Output from previous day:  Intake/Output Summary (Last 24 hours) at 12/31/15 0634 Last data filed at 12/31/15 0540  Gross per 24 hour  Intake              600 ml  Output             1025 ml  Net             -425 ml    Intake/Output this shift: Total I/O In: 600 [P.O.:600] Out: 775 [Urine:775]  Labs:  Recent Labs  12/28/15 2204 12/29/15 0424  HGB 13.5 12.7*    Recent Labs  12/28/15 2204 12/29/15 0424  WBC 10.6 10.5  RBC 5.49 5.19  HCT 42.1 39.4*  PLT 243 215    Recent Labs  12/28/15 2204 12/29/15 0424  NA  --  136  K  --  4.3  CL  --  108  CO2  --  22  BUN  --  14  CREATININE 1.05 1.08  GLUCOSE  --  220*  CALCIUM  --  8.5*   No results for input(s): LABPT, INR in the last 72 hours.   EXAM General - Patient is Alert and Oriented Extremity - Sensation intact distally Dorsiflexion/Plantar flexion intact No cellulitis present Compartment soft Dressing/Incision - clean, dry, No drainage Motor Function - intact, moving foot and toes well on exam. The patient ambulated 220 feet with physical therapy  Past Medical History:  Diagnosis Date  . Osteoarthritis of right hip 2017    Assessment/Plan: 3 Days Post-Op Procedure(s) (LRB): TOTAL HIP ARTHROPLASTY ANTERIOR APPROACH (Right) Active Problems:  Primary localized osteoarthritis of right hip  Estimated body mass index is 33.75 kg/m as calculated from the following:   Height as of this encounter: 6\' 3"  (1.905 m).   Weight as of this encounter: 122.5 kg (270 lb). Plan to discharge today to go home with home health physical therapy  DVT Prophylaxis - Lovenox, Foot Pumps and TED hose Weight-Bearing as tolerated to right leg  Dedra Skeensodd Joellyn Grandt, PA-C Orthopaedic Surgery 12/31/2015, 6:34 AM

## 2015-12-31 NOTE — Progress Notes (Signed)
Pt has received discharge paperwork and scripts for lovenox and oxycodone. Pt verbalized understanding of instructions reviewed. Pt is awaiting ride home at this time.

## 2016-01-14 DIAGNOSIS — Z96641 Presence of right artificial hip joint: Secondary | ICD-10-CM | POA: Insufficient documentation

## 2016-01-25 ENCOUNTER — Encounter: Payer: Self-pay | Admitting: Emergency Medicine

## 2016-01-25 ENCOUNTER — Emergency Department
Admission: EM | Admit: 2016-01-25 | Discharge: 2016-01-25 | Disposition: A | Discharge status: Home / Self Care | Payer: Medicaid Other | Attending: Student in an Organized Health Care Education/Training Program | Admitting: Student in an Organized Health Care Education/Training Program

## 2016-01-25 ENCOUNTER — Emergency Department: Payer: Medicaid Other

## 2016-01-25 DIAGNOSIS — F17210 Nicotine dependence, cigarettes, uncomplicated: Secondary | ICD-10-CM | POA: Diagnosis not present

## 2016-01-25 DIAGNOSIS — Y929 Unspecified place or not applicable: Secondary | ICD-10-CM | POA: Diagnosis not present

## 2016-01-25 DIAGNOSIS — Y999 Unspecified external cause status: Secondary | ICD-10-CM | POA: Insufficient documentation

## 2016-01-25 DIAGNOSIS — W010XXA Fall on same level from slipping, tripping and stumbling without subsequent striking against object, initial encounter: Secondary | ICD-10-CM | POA: Insufficient documentation

## 2016-01-25 DIAGNOSIS — Y9389 Activity, other specified: Secondary | ICD-10-CM | POA: Diagnosis not present

## 2016-01-25 DIAGNOSIS — S8991XA Unspecified injury of right lower leg, initial encounter: Secondary | ICD-10-CM | POA: Diagnosis present

## 2016-01-25 DIAGNOSIS — S8001XA Contusion of right knee, initial encounter: Secondary | ICD-10-CM | POA: Insufficient documentation

## 2016-01-25 DIAGNOSIS — Z79899 Other long term (current) drug therapy: Secondary | ICD-10-CM | POA: Diagnosis not present

## 2016-01-25 MED ORDER — HYDROCODONE-ACETAMINOPHEN 5-325 MG PO TABS: 1.0000 | ORAL_TABLET | Freq: Four times a day (QID) | ORAL | 0 refills | Status: DC | PRN

## 2016-01-25 NOTE — ED Triage Notes (Signed)
Pt arrived via ems from home. Pt had right hip surgery on 11/28 and is currently using a Ala Kratz to ambulate. Yesterday pt's cane slipped when walking down stair and pt landed on his right knee. Today the pain and increased and pt wanted to be seen for evaluation.

## 2016-01-25 NOTE — ED Provider Notes (Signed)
[facility]-EMERGENCY DEPARTMENT Provider Note   CSN: [id number] Arrival date & time: 01/25/16  2237     History   Chief Complaint Chief Complaint  Patient presents with  . Fall    HPI Charles Austin is a 60 y.o. male presents to the emergency department for evaluation of right knee pain. Patient is status post right total hip arthroplasty anterior approach in November 2017, he is currently ambulating with a walker doing well denies any hip discomfort, no warmth erythema or drainage from his incision. He was doing well recently started ambulating with a cane, tripped, fell down to his right knee and developed pain in the knee. Patient's knee pain is located along the patella, he is able to straight leg raise. He is ambulatory with no assisted devices. Pain is moderate, he is out of postoperative pain medications for his hip, was taken oxycodone several times daily as needed. He denies injuring his hip during his fall.    HPI  Past Medical History:  Diagnosis Date  . Osteoarthritis of right hip 2017    Patient Active Problem List   Diagnosis Date Noted  . Primary localized osteoarthritis of right hip 12/28/2015    Past Surgical History:  Procedure Laterality Date  . JOINT REPLACEMENT    . TOTAL HIP ARTHROPLASTY Right 12/28/2015   Procedure: TOTAL HIP ARTHROPLASTY ANTERIOR APPROACH;  Surgeon: Kennedy BuckerMichael Menz, MD;  Location: [facility] ORS;  Service: Orthopedics;  Laterality: Right;       Home Medications    Prior to Admission medications   Medication Sig Start Date End Date Taking? Authorizing Provider  enoxaparin (LOVENOX) 40 MG/0.4ML injection Inject 0.4 mLs (40 mg total) into the skin daily. 12/30/15   Dedra Skeensodd Mundy, PA-C  HYDROcodone-acetaminophen (NORCO) 5-325 MG tablet Take 1 tablet by mouth every 6 (six) hours as needed for moderate pain. 01/25/16   Evon Slackhomas C Gaines, PA-C  oxyCODONE (OXY IR/ROXICODONE) 5 MG immediate release tablet Take 1-2 tablets (5-10 mg total) by mouth every  3 (three) hours as needed for breakthrough pain. 12/30/15   Dedra Skeensodd Mundy, PA-C    Family History Family History  Problem Relation Age of Onset  . Diabetes Mother   . Alzheimer's disease Mother   . Diabetes Father     Social History Social History  Substance Use Topics  . Smoking status: Current Every Day Smoker    Packs/day: 0.25    Types: Cigarettes  . Smokeless tobacco: Never Used  . Alcohol use Yes     Comment: occassional beer     Allergies   Patient has no known allergies.   Review of Systems Review of Systems  Constitutional: Negative.  Negative for activity change, appetite change, chills and fever.  HENT: Negative for congestion, ear pain, mouth sores, rhinorrhea, sinus pressure, sore throat and trouble swallowing.   Eyes: Negative for photophobia, pain and discharge.  Respiratory: Negative for cough, chest tightness and shortness of breath.   Cardiovascular: Negative for chest pain and leg swelling.  Gastrointestinal: Negative for abdominal distention, abdominal pain, diarrhea, nausea and vomiting.  Genitourinary: Negative for difficulty urinating and dysuria.  Musculoskeletal: Positive for arthralgias. Negative for back pain, gait problem and joint swelling.  Skin: Negative for color change and rash.  Neurological: Negative for dizziness and headaches.  Hematological: Negative for adenopathy.  Psychiatric/Behavioral: Negative for agitation and behavioral problems.     Physical Exam Updated Vital Signs BP (!) 200/98 (BP Location: Right Arm)   Pulse 85   Temp 98.3 F (  36.8 C) (Oral)   Resp 18   Ht 6\' 2"  (1.88 m)   Wt 122.5 kg   SpO2 98%   BMI 34.67 kg/m   Physical Exam  Constitutional: He appears well-developed and well-nourished.  HENT:  Head: Normocephalic and atraumatic.  Eyes: Conjunctivae are normal.  Neck: Neck supple.  Cardiovascular: Normal rate and regular rhythm.   No murmur heard. Pulmonary/Chest: Effort normal and breath sounds  normal. No respiratory distress.  Abdominal: Soft. There is no tenderness.  Musculoskeletal: He exhibits no edema.  Examination of the right lower extremity shows the patient's incision site to be healed on the anterior groin. No warmth erythema or drainage. He has no pain with right hip internal and external rotation. He is tender to palpation along the anterior patella with mild soft tissue swelling noted. He is able to straight leg raise with no discomfort. Knee is stable to valgus and varus stress testing. No pain with valgus and varus stress testing. There is no skin breakdown noted. No effusion to the right knee.  Neurological: He is alert.  Skin: Skin is warm and dry.  Psychiatric: He has a normal mood and affect.  Nursing note and vitals reviewed.    ED Treatments / Results  Labs (all labs ordered are listed, but only abnormal results are displayed) Labs Reviewed - No data to display  EKG  EKG Interpretation None       Radiology No results found.  Procedures Procedures (including critical care time)  Medications Ordered in ED Medications - No data to display   Initial Impression / Assessment and Plan / ED Course  I have reviewed the triage vital signs and the nursing notes.  Pertinent labs & imaging results that were available during my care of the patient were reviewed by me and considered in my medical decision making (see chart for details).  Clinical Course     60 year old male with right knee pain after mechanical fall. Knee shows no signs of ligamentous injury. He is able to straight leg raise with no discomfort. He has mild soft tissue swelling along the anterior patella, x-ray show no evidence of acute bony abnormality. Recommend patient continue with physical therapy for his right hip, recommend avoiding the cane and continuing with the walker at this time. He is given prescription for Norco as needed for pain. He will follow-up with his orthopedist January  10.  Final Clinical Impressions(s) / ED Diagnoses   Final diagnoses:  Contusion of right knee, initial encounter    New Prescriptions New Prescriptions   HYDROCODONE-ACETAMINOPHEN (NORCO) 5-325 MG TABLET    Take 1 tablet by mouth every 6 (six) hours as needed for moderate pain.     Evon Slackhomas C Gaines, PA-C 01/25/16 2334    Willy EddyPatrick Robinson, MD 01/25/16 [id number]

## 2016-01-25 NOTE — Discharge Instructions (Signed)
Please ambulate with her walker. Rest ice and elevate the leg. Ice the knee 20 minutes every hour. Tylenol as needed for pain. Follow-up with your orthopedic doctor or the walk-in clinic in 5-7 days if no improvement.

## 2016-04-27 ENCOUNTER — Other Ambulatory Visit: Payer: Self-pay | Admitting: Orthopedic Surgery

## 2016-04-27 DIAGNOSIS — M9973 Connective tissue and disc stenosis of intervertebral foramina of lumbar region: Secondary | ICD-10-CM

## 2016-05-13 ENCOUNTER — Ambulatory Visit: Payer: Medicaid Other

## 2016-05-24 ENCOUNTER — Ambulatory Visit: Payer: Medicaid Other

## 2016-06-06 ENCOUNTER — Ambulatory Visit
Admission: RE | Admit: 2016-06-06 | Discharge: 2016-06-06 | Disposition: A | Discharge status: Home / Self Care | Payer: Medicaid Other | Source: Ambulatory Visit | Attending: Orthopedic Surgery | Admitting: Orthopedic Surgery

## 2016-06-21 ENCOUNTER — Emergency Department
Admission: EM | Admit: 2016-06-21 | Discharge: 2016-06-21 | Disposition: A | Discharge status: Home / Self Care | Payer: Medicaid Other | Attending: Emergency Medicine | Admitting: Emergency Medicine

## 2016-06-21 ENCOUNTER — Ambulatory Visit: Admission: RE | Admit: 2016-06-21 | Payer: Medicaid Other | Source: Ambulatory Visit

## 2016-06-21 ENCOUNTER — Other Ambulatory Visit: Payer: Self-pay | Admitting: Orthopedic Surgery

## 2016-06-21 ENCOUNTER — Emergency Department: Payer: Medicaid Other

## 2016-06-21 ENCOUNTER — Encounter: Payer: Self-pay | Admitting: Medical Oncology

## 2016-06-21 DIAGNOSIS — F17210 Nicotine dependence, cigarettes, uncomplicated: Secondary | ICD-10-CM | POA: Insufficient documentation

## 2016-06-21 DIAGNOSIS — G8929 Other chronic pain: Secondary | ICD-10-CM

## 2016-06-21 DIAGNOSIS — M2391 Unspecified internal derangement of right knee: Secondary | ICD-10-CM

## 2016-06-21 DIAGNOSIS — M25561 Pain in right knee: Secondary | ICD-10-CM | POA: Insufficient documentation

## 2016-06-21 MED ORDER — DIFLUNISAL 500 MG PO TABS: 500.0000 mg | ORAL_TABLET | Freq: Two times a day (BID) | ORAL | 0 refills | Status: DC

## 2016-06-21 NOTE — ED Triage Notes (Signed)
Pt reports rt knee pain since beginning of the year. Pt denies injury.

## 2016-06-21 NOTE — Discharge Instructions (Signed)
Follow-up with Dr. Rosita KeaMenz for your chronic knee pain.   Begin taking Dolobid 500 mg twice a day with food. Continue using your walker for support.

## 2016-06-21 NOTE — ED Provider Notes (Signed)
[facility] Emergency Department Provider Note ____________________________________________  Time seen: 1:26 PM  I have reviewed the triage vital signs and the nursing notes.  HISTORY  Chief Complaint  Knee Pain   HPI Charles Austin is a 61 y.o. male arrives via EMS with complaint of right knee pain.Patient states that he has had knee problems since his right hip replacement November 2017. Patient states he has not had any continued problems with his hip pain. He states pain now is in his right knee. He has been seen by Dr. Rosita Kea in the orthopedic Department at [facility] for this. Patient had an appointment today with Dr. Trilby Drummer at 3 PM but states he could no longer take the pain and called EMS to bring him to the emergency room instead. Patient states he is not having any pain medication at home. Patient states he continues to walk with his walker. He states he has not been given a prescription for pain medication. He denies any recent injury. Currently he rates his pain as a 10 over 10.    Past Medical History:  Diagnosis Date  . Osteoarthritis of right hip 2017    Patient Active Problem List   Diagnosis Date Noted  . Primary localized osteoarthritis of right hip 12/28/2015    Past Surgical History:  Procedure Laterality Date  . JOINT REPLACEMENT    . TOTAL HIP ARTHROPLASTY Right 12/28/2015   Procedure: TOTAL HIP ARTHROPLASTY ANTERIOR APPROACH;  Surgeon: Kennedy Bucker, MD;  Location: [facility] ORS;  Service: Orthopedics;  Laterality: Right;    Prior to Admission medications   Medication Sig Start Date End Date Taking? Authorizing Provider  diflunisal (DOLOBID) 500 MG TABS tablet Take 1 tablet (500 mg total) by mouth 2 (two) times daily. 06/21/16   Tommi Rumps, PA-C    Allergies Patient has no known allergies.  Family History  Problem Relation Age of Onset  . Diabetes Mother   . Alzheimer's disease Mother   . Diabetes Father     Social  History Social History  Substance Use Topics  . Smoking status: Current Every Day Smoker    Packs/day: 0.25    Types: Cigarettes  . Smokeless tobacco: Never Used  . Alcohol use Yes     Comment: occassional beer    Review of Systems  Constitutional: Negative for fever. Cardiovascular: Negative for chest pain. Respiratory: Negative for shortness of breath. Musculoskeletal: Positive for right knee pain. Skin: Negative for rash. Neurological: Negative for  focal weakness or numbness. ____________________________________________  PHYSICAL EXAM:  VITAL SIGNS: ED Triage Vitals [06/21/16 1317]  Enc Vitals Group     BP (!) 166/89     Pulse Rate 83     Resp 18     Temp 98.3 F (36.8 C)     Temp Source Oral     SpO2 98 %     Weight 270 lb (122.5 kg)     Height 6\' 3"  (1.905 m)     Head Circumference      Peak Flow      Pain Score 10     Pain Loc      Pain Edu?      Excl. in GC?     Constitutional: Alert and oriented. Well appearing and in no distress. Head: Normocephalic and atraumatic. Neck: No stridor Cardiovascular: Normal rate, regular rhythm. Normal distal pulses. Respiratory: Normal respiratory effort. No wheezes/rales/rhonchi. Musculoskeletal: Examination of the right knee there is no gross deformity, erythema or  warmth to palpation. There is no appreciable effusion. There is crepitus with range of motion of the right knee. Neurologic:  Normal gait without ataxia. Normal speech and language. No gross focal neurologic deficits are appreciated. Skin:  Skin is warm, dry and intact.  Psychiatric: Mood and affect are normal. Patient exhibits appropriate insight and judgment. ____________________________________________   RADIOLOGY Right knee x-ray per radiologist: IMPRESSION:  1. Decreased pre patellar and distal quadriceps tendon soft tissue  swelling.  2. Mild medial joint space narrowing.  3. Previously noted Pelligrini-Stieda lesion.   I, Tommi Rumpshonda L Shaun Zuccaro,  personally viewed and evaluated these images (plain radiographs) as part of my medical decision making, as well as reviewing the written report by the radiologist.  ____________________________________________   INITIAL IMPRESSION / ASSESSMENT AND PLAN / ED COURSE  Patient states that he still has an appointment today with Dr. Rosita KeaMenz at 3:00 PM. Patient was discharged with a prescription for Dolobid 500 mg 1 twice a day with food as needed for pain. He is encouraged to keep his appointment with Dr. Rosita KeaMenz for follow-up of his chronic knee pain. He also was encouraged to use his walker for extra support.    ____________________________________________  FINAL CLINICAL IMPRESSION(S) / ED DIAGNOSES  Final diagnoses:  Chronic pain of right knee     Tommi RumpsSummers, Abdulaziz Toman L, PA-C 06/21/16 1449    Sharman CheekStafford, Phillip, MD 06/22/16 [phone]

## 2016-06-22 ENCOUNTER — Ambulatory Visit: Admission: RE | Admit: 2016-06-22 | Payer: Medicaid Other | Source: Ambulatory Visit

## 2016-07-04 ENCOUNTER — Ambulatory Visit: Payer: Medicaid Other

## 2016-07-10 ENCOUNTER — Ambulatory Visit: Payer: Medicaid Other

## 2016-07-19 ENCOUNTER — Ambulatory Visit
Admission: RE | Admit: 2016-07-19 | Discharge: 2016-07-19 | Disposition: A | Discharge status: Home / Self Care | Payer: Medicaid Other | Source: Ambulatory Visit | Attending: Orthopedic Surgery | Admitting: Orthopedic Surgery

## 2016-07-19 DIAGNOSIS — M2391 Unspecified internal derangement of right knee: Secondary | ICD-10-CM | POA: Insufficient documentation

## 2016-07-19 DIAGNOSIS — M1711 Unilateral primary osteoarthritis, right knee: Secondary | ICD-10-CM | POA: Insufficient documentation

## 2016-07-19 DIAGNOSIS — M67863 Other specified disorders of tendon, right knee: Secondary | ICD-10-CM | POA: Diagnosis not present

## 2016-08-10 ENCOUNTER — Inpatient Hospital Stay: Admission: RE | Admit: 2016-08-10 | Payer: Medicaid Other | Source: Ambulatory Visit

## 2016-08-14 ENCOUNTER — Encounter
Admission: RE | Admit: 2016-08-14 | Discharge: 2016-08-14 | Disposition: A | Discharge status: Home / Self Care | Payer: Medicaid Other | Source: Ambulatory Visit | Attending: Orthopedic Surgery | Admitting: Orthopedic Surgery

## 2016-08-14 DIAGNOSIS — Z01812 Encounter for preprocedural laboratory examination: Secondary | ICD-10-CM | POA: Insufficient documentation

## 2016-08-14 DIAGNOSIS — R9431 Abnormal electrocardiogram [ECG] [EKG]: Secondary | ICD-10-CM | POA: Insufficient documentation

## 2016-08-14 DIAGNOSIS — M2391 Unspecified internal derangement of right knee: Secondary | ICD-10-CM | POA: Diagnosis not present

## 2016-08-14 DIAGNOSIS — F17200 Nicotine dependence, unspecified, uncomplicated: Secondary | ICD-10-CM | POA: Diagnosis not present

## 2016-08-14 DIAGNOSIS — Z01818 Encounter for other preprocedural examination: Secondary | ICD-10-CM | POA: Insufficient documentation

## 2016-08-14 NOTE — Patient Instructions (Signed)
  Your procedure is scheduled on: August 17, 2016 (THURSDAY) Report to Same Day Surgery 2nd floor medical mall (Medical Mall Entrance-take elevator on left to 2nd floor.  Check in with surgery information desk.) To find out your arrival time please call [phone] between 1PM - 3PM on August 16, 2016 [facility](WEDNESDAY)  Remember: Instructions that are not followed completely may result in serious medical risk, up to and including death, or upon the discretion of your surgeon and anesthesiologist your surgery may need to be rescheduled.    _x___ 1. Do not eat food or drink liquids after midnight. No gum chewing or hard candies                               __x__ 2. No Alcohol for 24 hours before or after surgery.   __x__3. No Smoking for 24 prior to surgery.   ____  4. Bring all medications with you on the day of surgery if instructed.    __x__ 5. Notify your doctor if there is any change in your medical condition     (cold, fever, infections).     Do not wear jewelry, make-up, hairpins, clips or nail polish.  Do not wear lotions, powders, or perfumes.   Do not shave 48 hours prior to surgery. Men may shave face and neck.  Do not bring valuables to the hospital.    [facility] is not responsible for any belongings or valuables.               Contacts, dentures or bridgework may not be worn into surgery.  Leave your suitcase in the car. After surgery it may be brought to your room.  For patients admitted to the hospital, discharge time is determined by your treatment team                    Patients discharged the day of surgery will not be allowed to drive home.  You will need someone to drive you home and stay with you the night of your procedure.    Please read over the following fact sheets that you were given:   [facility] Preparing for Surgery and or MRSA Information   ___ Take anti-hypertensive (unless it includes a diuretic), cardiac, seizure, asthma,     anti-reflux and psychiatric  medicines. These include:  1.   2.  3.  4.  5.  6.  ____Fleets enema or Magnesium Citrate as directed.   _x___ Use CHG Soap or sage wipes as directed on instruction sheet   ____ Use inhalers on the day of surgery and bring to hospital day of surgery  ____ Stop Metformin and Janumet 2 days prior to surgery.    ____ Take 1/2 of usual insulin dose the night before surgery and none on the morning surgery  _x___ Follow recommendations from Cardiologist, Pulmonologist or PCP regarding          stopping Aspirin, Coumadin, Pllavix ,Eliquis, Effient, or Pradaxa, and Pletal.  X____Stop Anti-inflammatories such as Advil, Aleve, Ibuprofen, Motrin, Naproxen, Naprosyn, Goodies powders or aspirin products. OK to take Tylenol    _x___ Stop supplements until after surgery.  But may continue Vitamin D, Vitamin B, and multivitamin      ____ Bring C-Pap to the hospital.

## 2016-08-17 ENCOUNTER — Ambulatory Visit: Payer: Medicaid Other | Admitting: Anesthesiology

## 2016-08-17 ENCOUNTER — Ambulatory Visit
Admission: RE | Admit: 2016-08-17 | Discharge: 2016-08-17 | Disposition: A | Discharge status: Home / Self Care | Payer: Medicaid Other | Source: Ambulatory Visit | Attending: Orthopedic Surgery | Admitting: Orthopedic Surgery

## 2016-08-17 ENCOUNTER — Encounter
Admission: RE | Disposition: A | Discharge status: Home / Self Care | Payer: Self-pay | Source: Ambulatory Visit | Attending: Orthopedic Surgery

## 2016-08-17 DIAGNOSIS — F17200 Nicotine dependence, unspecified, uncomplicated: Secondary | ICD-10-CM | POA: Insufficient documentation

## 2016-08-17 DIAGNOSIS — Z96641 Presence of right artificial hip joint: Secondary | ICD-10-CM | POA: Diagnosis not present

## 2016-08-17 DIAGNOSIS — Z833 Family history of diabetes mellitus: Secondary | ICD-10-CM | POA: Diagnosis not present

## 2016-08-17 DIAGNOSIS — M65861 Other synovitis and tenosynovitis, right lower leg: Secondary | ICD-10-CM | POA: Insufficient documentation

## 2016-08-17 DIAGNOSIS — M2241 Chondromalacia patellae, right knee: Secondary | ICD-10-CM | POA: Diagnosis not present

## 2016-08-17 DIAGNOSIS — M238X1 Other internal derangements of right knee: Secondary | ICD-10-CM | POA: Insufficient documentation

## 2016-08-17 HISTORY — PX: KNEE ARTHROSCOPY WITH MEDIAL MENISECTOMY: SHX5651

## 2016-08-17 LAB — SURGICAL PCR SCREEN
MRSA, PCR: NEGATIVE
Staphylococcus aureus: NEGATIVE

## 2016-08-17 SURGERY — ARTHROSCOPY, KNEE, WITH MEDIAL MENISCECTOMY
Anesthesia: General | Site: Knee | Laterality: Right | Wound class: Clean

## 2016-08-17 MED ORDER — DEXAMETHASONE SODIUM PHOSPHATE 10 MG/ML IJ SOLN
INTRAMUSCULAR | Status: DC | PRN
  Administered 2016-08-17: 10 mg via INTRAVENOUS

## 2016-08-17 MED ORDER — MIDAZOLAM HCL 2 MG/2ML IJ SOLN
INTRAMUSCULAR | Status: AC
Start: 2016-08-17 — End: ?
  Filled 2016-08-17: qty 2

## 2016-08-17 MED ORDER — ONDANSETRON HCL 4 MG/2ML IJ SOLN: 4.0000 mg | Freq: Four times a day (QID) | INTRAMUSCULAR | Status: DC | PRN

## 2016-08-17 MED ORDER — BUPIVACAINE-EPINEPHRINE (PF) 0.5% -1:200000 IJ SOLN
INTRAMUSCULAR | Status: DC | PRN
  Administered 2016-08-17: 15 mL via PERINEURAL

## 2016-08-17 MED ORDER — DEXAMETHASONE SODIUM PHOSPHATE 10 MG/ML IJ SOLN
INTRAMUSCULAR | Status: AC
  Filled 2016-08-17: qty 1

## 2016-08-17 MED ORDER — BUPIVACAINE-EPINEPHRINE (PF) 0.5% -1:200000 IJ SOLN
INTRAMUSCULAR | Status: AC
  Filled 2016-08-17: qty 30

## 2016-08-17 MED ORDER — ONDANSETRON HCL 4 MG/2ML IJ SOLN
INTRAMUSCULAR | Status: AC
  Filled 2016-08-17: qty 2

## 2016-08-17 MED ORDER — OXYCODONE HCL 5 MG PO TABS: 5.0000 mg | ORAL_TABLET | Freq: Once | ORAL | Status: DC | PRN

## 2016-08-17 MED ORDER — OXYCODONE HCL 5 MG/5ML PO SOLN: 5.0000 mg | Freq: Once | ORAL | Status: DC | PRN

## 2016-08-17 MED ORDER — ACETAMINOPHEN 10 MG/ML IV SOLN
INTRAVENOUS | Status: DC | PRN
  Administered 2016-08-17: 1000 mg via INTRAVENOUS

## 2016-08-17 MED ORDER — FENTANYL CITRATE (PF) 100 MCG/2ML IJ SOLN
INTRAMUSCULAR | Status: AC
  Filled 2016-08-17: qty 2

## 2016-08-17 MED ORDER — METOCLOPRAMIDE HCL 10 MG PO TABS: 5.0000 mg | ORAL_TABLET | Freq: Three times a day (TID) | ORAL | Status: DC | PRN

## 2016-08-17 MED ORDER — FAMOTIDINE 20 MG PO TABS
ORAL_TABLET | ORAL | Status: AC
  Filled 2016-08-17: qty 1

## 2016-08-17 MED ORDER — PROPOFOL 10 MG/ML IV BOLUS
INTRAVENOUS | Status: DC | PRN
  Administered 2016-08-17: 200 mg via INTRAVENOUS

## 2016-08-17 MED ORDER — LACTATED RINGERS IV SOLN
INTRAVENOUS | Status: DC
  Administered 2016-08-17: 08:00:00 via INTRAVENOUS

## 2016-08-17 MED ORDER — FENTANYL CITRATE (PF) 100 MCG/2ML IJ SOLN: 25.0000 ug | INTRAMUSCULAR | Status: DC | PRN

## 2016-08-17 MED ORDER — HYDROCODONE-ACETAMINOPHEN 5-325 MG PO TABS: 1.0000 | ORAL_TABLET | ORAL | Status: DC | PRN

## 2016-08-17 MED ORDER — LIDOCAINE HCL (PF) 2 % IJ SOLN
INTRAMUSCULAR | Status: AC
  Filled 2016-08-17: qty 2

## 2016-08-17 MED ORDER — ONDANSETRON HCL 4 MG/2ML IJ SOLN
INTRAMUSCULAR | Status: DC | PRN
  Administered 2016-08-17: 4 mg via INTRAVENOUS

## 2016-08-17 MED ORDER — LIDOCAINE HCL (CARDIAC) 20 MG/ML IV SOLN
INTRAVENOUS | Status: DC | PRN
  Administered 2016-08-17: 100 mg via INTRAVENOUS

## 2016-08-17 MED ORDER — METOCLOPRAMIDE HCL 5 MG/ML IJ SOLN: 5.0000 mg | Freq: Three times a day (TID) | INTRAMUSCULAR | Status: DC | PRN

## 2016-08-17 MED ORDER — ACETAMINOPHEN 10 MG/ML IV SOLN
INTRAVENOUS | Status: AC
  Filled 2016-08-17: qty 100

## 2016-08-17 MED ORDER — PROPOFOL 10 MG/ML IV BOLUS
INTRAVENOUS | Status: AC
  Filled 2016-08-17: qty 20

## 2016-08-17 MED ORDER — SODIUM CHLORIDE 0.9 % IV SOLN: INTRAVENOUS | Status: DC

## 2016-08-17 MED ORDER — FENTANYL CITRATE (PF) 100 MCG/2ML IJ SOLN
INTRAMUSCULAR | Status: DC | PRN
  Administered 2016-08-17 (×2): 50 ug via INTRAVENOUS
  Administered 2016-08-17: 100 ug via INTRAVENOUS

## 2016-08-17 MED ORDER — MIDAZOLAM HCL 2 MG/2ML IJ SOLN
INTRAMUSCULAR | Status: DC | PRN
  Administered 2016-08-17: 2 mg via INTRAVENOUS

## 2016-08-17 MED ORDER — HYDROCODONE-ACETAMINOPHEN 5-325 MG PO TABS: 1.0000 | ORAL_TABLET | Freq: Four times a day (QID) | ORAL | 0 refills | Status: DC | PRN

## 2016-08-17 MED ORDER — ONDANSETRON HCL 4 MG PO TABS: 4.0000 mg | ORAL_TABLET | Freq: Four times a day (QID) | ORAL | Status: DC | PRN

## 2016-08-17 MED ORDER — FAMOTIDINE 20 MG PO TABS
20.0000 mg | ORAL_TABLET | Freq: Once | ORAL | Status: AC
  Administered 2016-08-17: 20 mg via ORAL

## 2016-08-17 SURGICAL SUPPLY — 28 items
BANDAGE ACE 4X5 VEL STRL LF (GAUZE/BANDAGES/DRESSINGS) ×3 IMPLANT
BLADE INCISOR PLUS 4.5 (BLADE) IMPLANT
CAST PADDING 3X4FT ST 30246 (SOFTGOODS) ×2
CHLORAPREP W/TINT 26ML (MISCELLANEOUS) ×3 IMPLANT
CUFF TOURN 24 STER (MISCELLANEOUS) IMPLANT
CUFF TOURN 30 STER DUAL PORT (MISCELLANEOUS) ×3 IMPLANT
GAUZE SPONGE 4X4 12PLY STRL (GAUZE/BANDAGES/DRESSINGS) ×3 IMPLANT
GAUZE XEROFORM 4X4 STRL (GAUZE/BANDAGES/DRESSINGS) ×3 IMPLANT
GLOVE SURG SYN 9.0  PF PI (GLOVE) ×2
GLOVE SURG SYN 9.0 PF PI (GLOVE) ×1 IMPLANT
GOWN SRG 2XL LVL 4 RGLN SLV (GOWNS) ×1 IMPLANT
GOWN STRL NON-REIN 2XL LVL4 (GOWNS) ×2
GOWN STRL REUS W/ TWL LRG LVL3 (GOWN DISPOSABLE) ×2 IMPLANT
GOWN STRL REUS W/TWL LRG LVL3 (GOWN DISPOSABLE) ×4
IV LACTATED RINGER IRRG 3000ML (IV SOLUTION) ×4
IV LR IRRIG 3000ML ARTHROMATIC (IV SOLUTION) ×2 IMPLANT
KIT RM TURNOVER STRD PROC AR (KITS) ×3 IMPLANT
MANIFOLD NEPTUNE II (INSTRUMENTS) ×3 IMPLANT
NEEDLE HYPO 25GX1 SAFETY (NEEDLE) ×6 IMPLANT
PACK ARTHROSCOPY KNEE (MISCELLANEOUS) ×3 IMPLANT
PAD CAST CTTN 3X4 STRL (SOFTGOODS) ×1 IMPLANT
SET TUBE SUCT SHAVER OUTFL 24K (TUBING) ×3 IMPLANT
SET TUBE TIP INTRA-ARTICULAR (MISCELLANEOUS) ×3 IMPLANT
SUT ETHILON 4-0 (SUTURE) ×2
SUT ETHILON 4-0 FS2 18XMFL BLK (SUTURE) ×1
SUTURE ETHLN 4-0 FS2 18XMF BLK (SUTURE) ×1 IMPLANT
TUBING ARTHRO INFLOW-ONLY STRL (TUBING) ×3 IMPLANT
WAND COBLATION FLOW 50 (SURGICAL WAND) ×3 IMPLANT

## 2016-08-17 NOTE — OR Nursing (Signed)
PCR screen positive 11/17 prior to hip surgery, PCR screen this am notified Dr Rosita KeaMenz,, no orders for antibiotics.  Patient on contact isolation.

## 2016-08-17 NOTE — Transfer of Care (Signed)
Immediate Anesthesia Transfer of Care Note  Patient: Charles Austin  Procedure(s) Performed: Procedure(s): KNEE ARTHROSCOPY WITH partial MEDIAL MENISECTOMY, PARTIAL SYNOVECTOMY (Right)  Patient Location: PACU  Anesthesia Type:General  Level of Consciousness: sedated  Airway & Oxygen Therapy: Patient Spontanous Breathing and Patient connected to face mask oxygen  Post-op Assessment: Report given to RN and Post -op Vital signs reviewed and stable  Post vital signs: Reviewed and stable  Last Vitals:  Vitals:   08/17/16 0732  BP: (!) 187/82  Pulse: 77  Resp: 20  Temp: (!) 36.4 C    Last Pain:  Vitals:   08/17/16 0732  TempSrc: Tympanic  PainSc: 10-Worst pain ever         Complications: No apparent anesthesia complications

## 2016-08-17 NOTE — OR Nursing (Signed)
Encouraged patient to establish a relationship with a medical doctor to evaluate his blood pressure.  Levels remain elevated throughout his SDS process.

## 2016-08-17 NOTE — Anesthesia Procedure Notes (Signed)
Procedure Name: LMA Insertion Date/Time: 08/17/2016 8:38 AM Performed by: Junious SilkNOLES, Charles Sidman Pre-anesthesia Checklist: Patient identified, Emergency Drugs available, Suction available and Patient being monitored Patient Re-evaluated:Patient Re-evaluated prior to induction Oxygen Delivery Method: Circle system utilized Preoxygenation: Pre-oxygenation with 100% oxygen Induction Type: IV induction Ventilation: Mask ventilation without difficulty LMA: LMA inserted LMA Size: 4.5 Number of attempts: 1 Placement Confirmation: positive ETCO2 and breath sounds checked- equal and bilateral Tube secured with: Tape Dental Injury: Teeth and Oropharynx as per pre-operative assessment

## 2016-08-17 NOTE — Op Note (Signed)
08/17/2016  9:19 AM  PATIENT:  Charles Austin  61 y.o. male  PRE-OPERATIVE DIAGNOSIS:  INTERNAL DERANGEMENT RIGHT KNEE  POST-OPERATIVE DIAGNOSIS:  INTERNAL DERANGEMENT RIGHT KNEE  PROCEDURE:  Procedure(s): KNEE ARTHROSCOPY WITH partial MEDIAL MENISECTOMY, PARTIAL SYNOVECTOMY (Right)  SURGEON: Leitha SchullerMichael J Kenny Rea, MD  ASSISTANTS: None  ANESTHESIA:   general  EBL:  No intake/output data recorded.  BLOOD ADMINISTERED:none  DRAINS: none   LOCAL MEDICATIONS USED:  MARCAINE     SPECIMEN:  No Specimen  DISPOSITION OF SPECIMEN:  N/A  COUNTS:  YES  TOURNIQUET:    IMPLANTS: None  DICTATION: .Dragon Dictation patient brought the operating room and after adequate anesthesia was obtained the right leg was prepped and draped in sterile fashion with an arthroscopic leg holder and tourniquet applied. After patient identification and timeout procedures were completed, an inferior lateral portal was made. Initial inspection revealed some moderate patellofemoral chondromalacia with some fissuring of the articular cartilage and extensive synovitis in the suprapatellar pouch as well as a thick large medial plica band that was subsequently ablated with the ArthroCare wand as well as partial synovectomy in the suprapatellar pouch. An inferior medial portal was made and on probing the medial meniscus at the junction of the middle and posterior thirds there is a tear consistent with the MRI findings and wand was used to ablate this tissue back to a stable margin. The articular cartilage was relatively normal the medial compartment. The anterior cruciate ligament was intact and lateral compartment showed more degenerative changes to the cart tibial articular cartilage but overall femoral condyle appeared be in good condition. The meniscus was intact. The gutters were free of any loose bodies after having addressed the medial meniscus tear of the plica band ends suprapatellar pouch synovitis was treated with  the ArthroCare wand ablating this tissue and the knee was thoroughly irrigated and argentation was withdrawn. Pictures taken pre-and post procedure with the camera. Wounds were closed with simple interrupted 4-0 nylon followed by injection of 15 cc of half percent Sensorcaine with epinephrine for postop analgesia. Xeroform 4 x 4's web roll and Ace wrap applied  PLAN OF CARE: Discharge to home after PACU  PATIENT DISPOSITION:  PACU - hemodynamically stable.

## 2016-08-17 NOTE — Anesthesia Post-op Follow-up Note (Cosign Needed)
Anesthesia QCDR form completed.        

## 2016-08-17 NOTE — H&P (Signed)
Reviewed paper H+P, will be scanned into chart. No changes noted.  

## 2016-08-17 NOTE — Anesthesia Preprocedure Evaluation (Signed)
Anesthesia Evaluation  Patient identified by MRN, date of birth, ID band Patient awake    Reviewed: Allergy & Precautions, H&P , NPO status , Patient's Chart, lab work & pertinent test results  History of Anesthesia Complications Negative for: history of anesthetic complications  Airway Mallampati: III  TM Distance: >3 FB Neck ROM: full    Dental  (+) Poor Dentition, Chipped, Missing   Pulmonary neg shortness of breath, COPD, Current Smoker,           Cardiovascular Exercise Tolerance: Good (-) angina(-) Past MI and (-) DOE negative cardio ROS       Neuro/Psych negative neurological ROS  negative psych ROS   GI/Hepatic negative GI ROS, Neg liver ROS, neg GERD  ,  Endo/Other  negative endocrine ROS  Renal/GU      Musculoskeletal  (+) Arthritis ,   Abdominal   Peds  Hematology negative hematology ROS (+)   Anesthesia Other Findings Signs and symptoms suggestive of sleep apnea   Past Medical History: 2017: Osteoarthritis of right hip  Past Surgical History: No date: JOINT REPLACEMENT; Right     Comment:  TOTAL HIP REPLACEMENT 12/28/2015: TOTAL HIP ARTHROPLASTY; Right     Comment:  Procedure: TOTAL HIP ARTHROPLASTY ANTERIOR APPROACH;                Surgeon: Kennedy BuckerMichael Menz, MD;  Location: [facility] ORS;  Service:              Orthopedics;  Laterality: Right;  BMI    Body Mass Index:  34.67 kg/m      Reproductive/Obstetrics negative OB ROS                             Anesthesia Physical Anesthesia Plan  ASA: III  Anesthesia Plan: General LMA   Post-op Pain Management:    Induction: Intravenous  PONV Risk Score and Plan:   Airway Management Planned: LMA  Additional Equipment:   Intra-op Plan:   Post-operative Plan: Extubation in OR  Informed Consent: I have reviewed the patients History and Physical, chart, labs and discussed the procedure including the risks, benefits  and alternatives for the proposed anesthesia with the patient or authorized representative who has indicated his/her understanding and acceptance.   Dental Advisory Given  Plan Discussed with: Anesthesiologist, CRNA and Surgeon  Anesthesia Plan Comments: (Patient consented for risks of anesthesia including but not limited to:  - adverse reactions to medications - damage to teeth, lips or other oral mucosa - sore throat or hoarseness - Damage to heart, brain, lungs or loss of life  Patient voiced understanding.)        Anesthesia Quick Evaluation

## 2016-08-17 NOTE — Discharge Instructions (Signed)
AMBULATORY SURGERY  DISCHARGE INSTRUCTIONS   1) The drugs that you were given will stay in your system until tomorrow so for the next 24 hours you should not:  A) Drive an automobile B) Make any legal decisions C) Drink any alcoholic beverage   2) You may resume regular meals tomorrow.  Today it is better to start with liquids and gradually work up to solid foods.  You may eat anything you prefer, but it is better to start with liquids, then soup and crackers, and gradually work up to solid foods.   3) Please notify your doctor immediately if you have any unusual bleeding, trouble breathing, redness and pain at the surgery site, drainage, fever, or pain not relieved by medication.    4) Additional Instructions:Per physician, you can weight bare on right leg as tolerated. Keep leg elevated and iced over next 3 days and then as needed for discomfort. Take pain medication as per instructions.  Take stool softeners along with pain medicine.    Please contact your physician with any problems or Same Day Surgery at [phone], Monday through Friday 6 am to 4 pm, or [place] at [facility] Main number at [phone].

## 2016-08-17 NOTE — Anesthesia Postprocedure Evaluation (Signed)
Anesthesia Post Note  Patient: Pricilla HolmRichard L Zuercher  Procedure(s) Performed: Procedure(s) (LRB): KNEE ARTHROSCOPY WITH partial MEDIAL MENISECTOMY, PARTIAL SYNOVECTOMY (Right)  Patient location during evaluation: PACU Anesthesia Type: General Level of consciousness: awake and alert Pain management: pain level controlled Vital Signs Assessment: post-procedure vital signs reviewed and stable Respiratory status: spontaneous breathing, nonlabored ventilation, respiratory function stable and patient connected to nasal cannula oxygen Cardiovascular status: blood pressure returned to baseline and stable Postop Assessment: no signs of nausea or vomiting Anesthetic complications: no     Last Vitals:  Vitals:   08/17/16 1019 08/17/16 1059  BP: (!) 182/100 (!) 155/106  Pulse:  (!) 58  Resp:  15  Temp:      Last Pain:  Vitals:   08/17/16 1059  TempSrc:   PainSc: 0-No pain                 Cleda MccreedyJoseph K Piscitello

## 2017-07-17 ENCOUNTER — Other Ambulatory Visit: Payer: Self-pay | Admitting: Orthopedic Surgery

## 2017-07-17 DIAGNOSIS — Z96641 Presence of right artificial hip joint: Secondary | ICD-10-CM

## 2017-07-17 DIAGNOSIS — M2170 Unequal limb length (acquired), unspecified site: Secondary | ICD-10-CM

## 2017-07-17 DIAGNOSIS — M898X9 Other specified disorders of bone, unspecified site: Secondary | ICD-10-CM

## 2017-07-28 ENCOUNTER — Encounter
Admission: RE | Admit: 2017-07-28 | Discharge: 2017-07-28 | Disposition: A | Discharge status: Home / Self Care | Payer: Medicare Other | Source: Ambulatory Visit | Attending: Orthopedic Surgery | Admitting: Orthopedic Surgery

## 2017-07-28 DIAGNOSIS — M898X9 Other specified disorders of bone, unspecified site: Secondary | ICD-10-CM | POA: Insufficient documentation

## 2017-07-28 DIAGNOSIS — Z96641 Presence of right artificial hip joint: Secondary | ICD-10-CM | POA: Diagnosis present

## 2017-07-28 DIAGNOSIS — M2170 Unequal limb length (acquired), unspecified site: Secondary | ICD-10-CM | POA: Insufficient documentation

## 2017-07-28 MED ORDER — TECHNETIUM TC 99M MEDRONATE IV KIT
20.0000 | PACK | Freq: Once | INTRAVENOUS | Status: AC | PRN
  Administered 2017-07-28: 23.59 via INTRAVENOUS

## 2017-09-21 ENCOUNTER — Inpatient Hospital Stay: Admission: RE | Admit: 2017-09-21 | Payer: Medicare Other | Source: Ambulatory Visit

## 2017-09-25 ENCOUNTER — Other Ambulatory Visit: Payer: Self-pay

## 2017-09-25 ENCOUNTER — Encounter
Admission: RE | Admit: 2017-09-25 | Discharge: 2017-09-25 | Disposition: A | Discharge status: Home / Self Care | Payer: Medicare Other | Source: Ambulatory Visit | Attending: Orthopedic Surgery | Admitting: Orthopedic Surgery

## 2017-09-25 DIAGNOSIS — Z01812 Encounter for preprocedural laboratory examination: Secondary | ICD-10-CM | POA: Insufficient documentation

## 2017-09-25 DIAGNOSIS — M2170 Unequal limb length (acquired), unspecified site: Secondary | ICD-10-CM | POA: Diagnosis not present

## 2017-09-25 LAB — URINALYSIS, ROUTINE W REFLEX MICROSCOPIC
Bilirubin Urine: NEGATIVE
GLUCOSE, UA: NEGATIVE mg/dL
Hgb urine dipstick: NEGATIVE
KETONES UR: 5 mg/dL — AB
LEUKOCYTES UA: NEGATIVE
Nitrite: NEGATIVE
PROTEIN: NEGATIVE mg/dL
Specific Gravity, Urine: 1.029 (ref 1.005–1.030)
pH: 5 (ref 5.0–8.0)

## 2017-09-25 LAB — CBC
HCT: 44.8 % (ref 40.0–52.0)
Hemoglobin: 14.4 g/dL (ref 13.0–18.0)
MCH: 25.3 pg — ABNORMAL LOW (ref 26.0–34.0)
MCHC: 32.1 g/dL (ref 32.0–36.0)
MCV: 78.8 fL — ABNORMAL LOW (ref 80.0–100.0)
Platelets: 254 10*3/uL (ref 150–440)
RBC: 5.68 MIL/uL (ref 4.40–5.90)
RDW: 16.9 % — ABNORMAL HIGH (ref 11.5–14.5)
WBC: 6.4 10*3/uL (ref 3.8–10.6)

## 2017-09-25 LAB — BASIC METABOLIC PANEL
Anion gap: 8 (ref 5–15)
BUN: 12 mg/dL (ref 6–20)
CO2: 27 mmol/L (ref 22–32)
Calcium: 9.4 mg/dL (ref 8.9–10.3)
Chloride: 107 mmol/L (ref 98–111)
Creatinine, Ser: 0.81 mg/dL (ref 0.61–1.24)
GFR calc Af Amer: >60 mL/min (ref >60)
GFR calc non Af Amer: >60 mL/min (ref >60)
Glucose, Bld: 119 mg/dL — ABNORMAL HIGH (ref 70–99)
Potassium: 4.3 mmol/L (ref 3.5–5.1)
Sodium: 142 mmol/L (ref 135–145)

## 2017-09-25 LAB — PROTIME-INR
INR: 1.07
Prothrombin Time: 13.8 seconds (ref 11.4–15.2)

## 2017-09-25 LAB — TYPE AND SCREEN
ABO/RH(D): O POS
Antibody Screen: NEGATIVE

## 2017-09-25 LAB — APTT: APTT: 30 s (ref 24–36)

## 2017-09-25 LAB — SURGICAL PCR SCREEN
MRSA, PCR: NEGATIVE
Staphylococcus aureus: NEGATIVE

## 2017-09-25 LAB — SEDIMENTATION RATE: Sed Rate: 3 mm/hr (ref 0–20)

## 2017-09-25 NOTE — Patient Instructions (Signed)
  Your procedure is scheduled on: Thursday October 04, 2017 Report to Same Day Surgery 2nd floor medical mall [facility](Medical Mall Entrance-take elevator on left to 2nd floor.  Check in with surgery information desk.) To find out your arrival time please call [phone] between 1PM - 3PM on Wednesday October 03, 2017  Remember: Instructions that are not followed completely may result in serious medical risk, up to and including death, or upon the discretion of your surgeon and anesthesiologist your surgery may need to be rescheduled.    _x___ 1. Do not eat food (including mints, candies, chewing gum) after midnight the night before your procedure. You may drink clear liquids up to 2 hours before you are scheduled to arrive at the hospital for your procedure.  Do not drink clear liquids within 2 hours of your scheduled arrival to the hospital.  Clear liquids include  --Water or Apple juice without pulp  --Clear carbohydrate beverage such as Gatorade  --Black Coffee or Clear Tea (No milk, no creamers, do not add anything to the coffee or tea)    __x__ 2. No Alcohol for 24 hours before or after surgery.   __x__ 3. No Smoking or e-cigarettes for 24 prior to surgery.  Do not use any chewable tobacco products for at least 6 hour prior to surgery   __x__ 4. Notify your doctor if there is any change in your medical condition (cold, fever, infections).   __x__ 5. On the morning of surgery brush your teeth with toothpaste and water.  You may rinse your mouth with mouth wash if you wish.  Do not swallow any toothpaste or mouthwash.  Please read over the following fact sheets that you were given:   [facility] Preparing for Surgery and or MRSA Information    __x__ Use CHG Soap or sage wipes as directed on instruction sheet    Do not wear jewelry.  Do not wear lotions, powders, deodorant, or perfumes.   Do not shave below the face/neck 48 hours prior to surgery.   Do not bring valuables to the  hospital.    [facility] is not responsible for any belongings or valuables.               Contacts, dentures or bridgework may not be worn into surgery.  Leave your suitcase in the car. After surgery it may be brought to your room.  For patients admitted to the hospital, discharge time is determined by your treatment team.   _x___ Take anti-hypertensive listed below, cardiac, seizure, asthma, anti-reflux and psychiatric medicines. These include:  1. NONE  _x___ Follow recommendations from Cardiologist, Pulmonologist or PCP regarding stopping Aspirin, Coumadin, Plavix ,Eliquis, Effient, or Pradaxa, and Pletal.  _x___ Stop Anti-inflammatories such as Advil, Aleve, Ibuprofen, Motrin, Naproxen, Naprosyn, Goodies powders or aspirin products. OK to take Tylenol and Celebrex.   _x___ NOW: Stop supplements until after surgery.  But may continue Vitamin D, Vitamin B, and multivitamin.

## 2017-09-26 LAB — URINE CULTURE: CULTURE: NO GROWTH

## 2017-10-02 ENCOUNTER — Ambulatory Visit: Payer: Medicare Other | Admitting: Radiation Oncology

## 2017-10-03 ENCOUNTER — Ambulatory Visit: Payer: Medicare Other

## 2017-10-03 ENCOUNTER — Ambulatory Visit: Payer: Medicare Other | Attending: Radiation Oncology | Admitting: Radiation Oncology

## 2017-10-03 DIAGNOSIS — Z510 Encounter for antineoplastic radiation therapy: Secondary | ICD-10-CM | POA: Insufficient documentation

## 2017-10-03 DIAGNOSIS — M89759 Major osseous defect, unspecified pelvic region and thigh: Secondary | ICD-10-CM | POA: Insufficient documentation

## 2017-10-03 MED ORDER — CEFAZOLIN SODIUM-DEXTROSE 2-4 GM/100ML-% IV SOLN: 2.0000 g | Freq: Once | INTRAVENOUS | Status: DC

## 2017-10-04 ENCOUNTER — Encounter: Admission: RE | Payer: Self-pay | Source: Ambulatory Visit

## 2017-10-04 ENCOUNTER — Inpatient Hospital Stay: Admission: RE | Admit: 2017-10-04 | Payer: Medicare Other | Source: Ambulatory Visit | Admitting: Orthopedic Surgery

## 2017-10-04 SURGERY — TOTAL HIP REVISION
Anesthesia: Choice | Laterality: Right

## 2017-10-04 MED ORDER — TRANEXAMIC ACID 1000 MG/10ML IV SOLN
1000.0000 mg | INTRAVENOUS | Status: DC
  Filled 2017-10-04: qty 10

## 2017-10-05 ENCOUNTER — Ambulatory Visit: Payer: Medicare Other | Admitting: Radiation Oncology

## 2017-10-09 ENCOUNTER — Institutional Professional Consult (permissible substitution): Payer: Medicare Other | Admitting: Radiation Oncology

## 2017-10-15 ENCOUNTER — Other Ambulatory Visit: Payer: Self-pay

## 2017-10-15 ENCOUNTER — Ambulatory Visit
Admission: RE | Admit: 2017-10-15 | Discharge: 2017-10-15 | Disposition: A | Discharge status: Home / Self Care | Payer: Medicare Other | Source: Ambulatory Visit | Attending: Radiation Oncology | Admitting: Radiation Oncology

## 2017-10-15 ENCOUNTER — Encounter: Payer: Self-pay | Admitting: Radiation Oncology

## 2017-10-15 DIAGNOSIS — M898X9 Other specified disorders of bone, unspecified site: Secondary | ICD-10-CM

## 2017-10-15 DIAGNOSIS — F17210 Nicotine dependence, cigarettes, uncomplicated: Secondary | ICD-10-CM | POA: Insufficient documentation

## 2017-10-15 DIAGNOSIS — M89751 Major osseous defect, right pelvic region and thigh: Secondary | ICD-10-CM | POA: Insufficient documentation

## 2017-10-15 NOTE — Consult Note (Signed)
NEW PATIENT EVALUATION  Name: Charles Austin  MRN: [id number]  Date:   10/15/2017     DOB: 08/04/1955   This 62 y.o. male patient presents to the clinic for initial evaluation of eterotrophic ossification prevention in patient with revision of a total hip replacement to the right hip.  REFERRING PHYSICIAN: No ref. provider found  CHIEF COMPLAINT:  Chief Complaint  Patient presents with  . 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 He is going to be scheduled for revision of his hip implant with removal of this hypertrophic ossifications with postoperative radiation therapy to prevent reoccurrence. He is seen today and doing well specifically painful right hip and living with the assistance of a cane.  PLANNED TREATMENT REGIMEN: postoperative single-dose radiation therapy to prevent heterotrophic ossification  PAST MEDICAL HISTORY:  has a past medical history of Osteoarthritis of right hip (2017).    PAST SURGICAL HISTORY:  Past Surgical History:  Procedure Laterality Date  . JOINT REPLACEMENT Right    TOTAL HIP REPLACEMENT  . KNEE ARTHROSCOPY WITH MEDIAL MENISECTOMY Right 08/17/2016   Procedure: KNEE ARTHROSCOPY WITH partial MEDIAL MENISECTOMY, PARTIAL SYNOVECTOMY;  Surgeon: Kennedy BuckerMenz, Michael, MD;  Location: [facility] ORS;  Service: Orthopedics;  Laterality: Right;  . TOTAL HIP ARTHROPLASTY Right 12/28/2015   Procedure: TOTAL HIP ARTHROPLASTY ANTERIOR APPROACH;  Surgeon: Kennedy BuckerMichael Menz, MD;  Location: [facility] ORS;  Service: Orthopedics;  Laterality: Right;     FAMILY HISTORY: family history includes Alzheimer's disease in his mother; Diabetes in his father and mother.  SOCIAL HISTORY:  reports that he has been smoking cigarettes. He has been smoking about 0.25 packs per day. He has never used smokeless tobacco. He reports that he drinks alcohol. He reports that he does not use drugs.  ALLERGIES: Patient has no known allergies.  MEDICATIONS:  Current Outpatient Medications  Medication Sig Dispense Refill  . acetaminophen (TYLENOL) 325 MG tablet Take 650 mg by mouth daily as needed for moderate pain or headache.    Marland Kitchen. acetaminophen (TYLENOL) 325 MG tablet Take by mouth.     No current facility-administered medications for this encounter.     ECOG PERFORMANCE STATUS:  1 - Symptomatic but completely ambulatory  REVIEW OF SYSTEMS:  Patient denies any weight loss, fatigue, weakness, fever, chills or night sweats. Patient denies any loss of vision, blurred vision. Patient denies any ringing  of the ears or hearing loss. No irregular heartbeat. Patient denies heart murmur or history of fainting. Patient denies any chest pain or pain radiating to her upper extremities. Patient denies any shortness of breath, difficulty breathing at night, cough or hemoptysis. Patient denies any swelling in the lower legs. Patient denies any nausea vomiting, vomiting of blood, or coffee ground material in the vomitus. Patient denies any stomach pain. Patient states has had normal bowel movements no significant constipation or diarrhea. Patient denies any dysuria, hematuria or significant nocturia. Patient denies any problems walking, swelling in the joints or loss of balance. Patient denies any skin changes, loss of hair or loss of weight. Patient denies any excessive worrying or anxiety or significant  depression. Patient denies any problems with insomnia. Patient denies excessive thirst, polyuria, polydipsia. Patient denies any swollen glands, patient denies easy bruising or  easy bleeding. Patient denies any recent infections, allergies or URI. Patient "s visual fields have not changed significantly in recent time.    PHYSICAL EXAM: BP (!) (P) 178/88 (BP Location: Left Arm, Patient Position: Sitting)   Pulse (P) 75   Temp (!) (P) 95.9 F (35.5 C) (Tympanic)   Resp (P) 18   Wt (P) 232 lb 2.3 oz (105.3 kg)   BMI (P) 29.81 kg/m  Range of motion of his right extremities does not elicit significant pain. Point palpation to his right hip does elicit pain. Motor sensory and DTR levels are equal and symmetric in the lower extremities bilaterally.Well-developed well-nourished patient in NAD. HEENT reveals PERLA, EOMI, discs not visualized.  Oral cavity is clear. No oral mucosal lesions are identified. Neck is clear without evidence of cervical or supraclavicular adenopathy. Lungs are clear to A&P. Cardiac examination is essentially unremarkable with regular rate and rhythm without murmur rub or thrill. Abdomen is benign with no organomegaly or masses noted. Motor sensory and DTR levels are equal and symmetric in the upper and lower extremities. Cranial nerves II through XII are grossly intact. Proprioception is intact. No peripheral adenopathy or edema is identified. No motor or sensory levels are noted. Crude visual fields are within normal range.  LABORATORY DATA: no current pathology reports reviewed    RADIOLOGY RESULTS:plain films of his right hip as well as bone scan reviewed   IMPRESSION: and are atrophic ossification patient with prior right hip replacement for postoperative single fraction radiation therapy to prevent heterotrophic ossification.  PLAN: at this time I to go with single fraction 700 cGy 1 to his right hip. Risks and benefits of treatment including slight fatigue possible skin reaction all were discussed in detail with the patient. I have set him up for tomorrow for CT simulation to map out her treatment field. We will coordinate with surgeon to give  him his postoperative dose of radiation therapy with in 72 hours after completion of surgery. Patient Has been treatment plan well.  I would like to take this opportunity to thank you for allowing me to participate in the care of your patient.Carmina Miller, MD

## 2017-10-16 ENCOUNTER — Ambulatory Visit
Admission: RE | Admit: 2017-10-16 | Discharge: 2017-10-16 | Disposition: A | Discharge status: Home / Self Care | Payer: Medicare Other | Source: Ambulatory Visit | Attending: Radiation Oncology | Admitting: Radiation Oncology

## 2017-10-16 DIAGNOSIS — M89759 Major osseous defect, unspecified pelvic region and thigh: Secondary | ICD-10-CM | POA: Diagnosis not present

## 2017-10-16 DIAGNOSIS — Z510 Encounter for antineoplastic radiation therapy: Secondary | ICD-10-CM | POA: Diagnosis not present

## 2017-10-22 DIAGNOSIS — Z510 Encounter for antineoplastic radiation therapy: Secondary | ICD-10-CM | POA: Diagnosis not present

## 2017-10-23 ENCOUNTER — Other Ambulatory Visit: Payer: Medicare Other

## 2017-10-30 ENCOUNTER — Ambulatory Visit
Admission: RE | Admit: 2017-10-30 | Discharge: 2017-10-30 | Disposition: A | Discharge status: Home / Self Care | Payer: Medicare Other | Source: Ambulatory Visit | Attending: Radiation Oncology | Admitting: Radiation Oncology

## 2017-10-30 ENCOUNTER — Encounter
Admission: RE | Disposition: A | Discharge status: Home Health | Payer: Self-pay | Source: Ambulatory Visit | Attending: Orthopedic Surgery

## 2017-10-30 ENCOUNTER — Inpatient Hospital Stay: Payer: Medicare Other

## 2017-10-30 ENCOUNTER — Encounter: Payer: Self-pay | Admitting: *Deleted

## 2017-10-30 ENCOUNTER — Inpatient Hospital Stay
Admission: RE | Admit: 2017-10-30 | Discharge: 2017-11-02 | DRG: 468 | Disposition: A | Discharge status: Home Health | Payer: Medicare Other | Source: Ambulatory Visit | Attending: Orthopedic Surgery | Admitting: Orthopedic Surgery

## 2017-10-30 ENCOUNTER — Other Ambulatory Visit: Payer: Self-pay

## 2017-10-30 ENCOUNTER — Inpatient Hospital Stay: Payer: Medicare Other | Admitting: Anesthesiology

## 2017-10-30 DIAGNOSIS — F17210 Nicotine dependence, cigarettes, uncomplicated: Secondary | ICD-10-CM | POA: Diagnosis present

## 2017-10-30 DIAGNOSIS — Z510 Encounter for antineoplastic radiation therapy: Secondary | ICD-10-CM | POA: Insufficient documentation

## 2017-10-30 DIAGNOSIS — M21751 Unequal limb length (acquired), right femur: Secondary | ICD-10-CM | POA: Diagnosis present

## 2017-10-30 DIAGNOSIS — M89759 Major osseous defect, unspecified pelvic region and thigh: Secondary | ICD-10-CM | POA: Insufficient documentation

## 2017-10-30 DIAGNOSIS — M898X9 Other specified disorders of bone, unspecified site: Secondary | ICD-10-CM | POA: Diagnosis present

## 2017-10-30 DIAGNOSIS — M25851 Other specified joint disorders, right hip: Secondary | ICD-10-CM | POA: Diagnosis present

## 2017-10-30 DIAGNOSIS — Z419 Encounter for procedure for purposes other than remedying health state, unspecified: Secondary | ICD-10-CM

## 2017-10-30 HISTORY — PX: TOTAL HIP ARTHROPLASTY: SHX124

## 2017-10-30 LAB — TYPE AND SCREEN
ABO/RH(D): O POS
Antibody Screen: NEGATIVE

## 2017-10-30 SURGERY — ARTHROPLASTY, HIP, TOTAL, ANTERIOR APPROACH
Anesthesia: Spinal | Laterality: Right

## 2017-10-30 MED ORDER — PROPOFOL 10 MG/ML IV BOLUS
INTRAVENOUS | Status: AC
  Filled 2017-10-30: qty 20

## 2017-10-30 MED ORDER — NEOMYCIN-POLYMYXIN B GU 40-200000 IR SOLN
Status: AC
  Filled 2017-10-30: qty 4

## 2017-10-30 MED ORDER — BUPIVACAINE-EPINEPHRINE (PF) 0.25% -1:200000 IJ SOLN
INTRAMUSCULAR | Status: AC
  Filled 2017-10-30: qty 30

## 2017-10-30 MED ORDER — HYDROMORPHONE HCL 1 MG/ML IJ SOLN: 0.5000 mg | INTRAMUSCULAR | Status: DC | PRN

## 2017-10-30 MED ORDER — PROPOFOL 500 MG/50ML IV EMUL
INTRAVENOUS | Status: AC
  Filled 2017-10-30: qty 50

## 2017-10-30 MED ORDER — ONDANSETRON HCL 4 MG/2ML IJ SOLN: 4.0000 mg | Freq: Once | INTRAMUSCULAR | Status: DC | PRN

## 2017-10-30 MED ORDER — TRAMADOL HCL 50 MG PO TABS
50.0000 mg | ORAL_TABLET | Freq: Four times a day (QID) | ORAL | Status: DC
  Administered 2017-10-30 – 2017-11-02 (×11): 50 mg via ORAL
  Filled 2017-10-30 (×11): qty 1

## 2017-10-30 MED ORDER — ONDANSETRON HCL 4 MG PO TABS: 4.0000 mg | ORAL_TABLET | Freq: Four times a day (QID) | ORAL | Status: DC | PRN

## 2017-10-30 MED ORDER — TRANEXAMIC ACID 1000 MG/10ML IV SOLN
1000.0000 mg | INTRAVENOUS | Status: AC
  Administered 2017-10-30: 1000 mg via INTRAVENOUS
  Filled 2017-10-30: qty 1000

## 2017-10-30 MED ORDER — METHOCARBAMOL 500 MG PO TABS
500.0000 mg | ORAL_TABLET | Freq: Four times a day (QID) | ORAL | Status: DC | PRN
  Administered 2017-10-31: 500 mg via ORAL
  Filled 2017-10-30: qty 1

## 2017-10-30 MED ORDER — MENTHOL 3 MG MT LOZG
1.0000 | LOZENGE | OROMUCOSAL | Status: DC | PRN
  Filled 2017-10-30: qty 9

## 2017-10-30 MED ORDER — NEOMYCIN-POLYMYXIN B GU 40-200000 IR SOLN
Status: DC | PRN
  Administered 2017-10-30: 4 mL

## 2017-10-30 MED ORDER — CEFAZOLIN SODIUM-DEXTROSE 2-4 GM/100ML-% IV SOLN
INTRAVENOUS | Status: AC
  Filled 2017-10-30: qty 100

## 2017-10-30 MED ORDER — BUPIVACAINE HCL (PF) 0.5 % IJ SOLN
INTRAMUSCULAR | Status: DC | PRN
  Administered 2017-10-30: 3 mL

## 2017-10-30 MED ORDER — FENTANYL CITRATE (PF) 100 MCG/2ML IJ SOLN: 25.0000 ug | INTRAMUSCULAR | Status: DC | PRN

## 2017-10-30 MED ORDER — FAMOTIDINE 20 MG PO TABS
ORAL_TABLET | ORAL | Status: AC
  Administered 2017-10-30: 20 mg via ORAL
  Filled 2017-10-30: qty 1

## 2017-10-30 MED ORDER — BISACODYL 5 MG PO TBEC
5.0000 mg | DELAYED_RELEASE_TABLET | Freq: Every day | ORAL | Status: DC | PRN
  Administered 2017-10-31: 5 mg via ORAL
  Filled 2017-10-30: qty 1

## 2017-10-30 MED ORDER — PHENOL 1.4 % MT LIQD
1.0000 | OROMUCOSAL | Status: DC | PRN
  Filled 2017-10-30: qty 177

## 2017-10-30 MED ORDER — OXYCODONE HCL 5 MG PO TABS
5.0000 mg | ORAL_TABLET | ORAL | Status: DC | PRN
  Filled 2017-10-30 (×2): qty 1

## 2017-10-30 MED ORDER — MIDAZOLAM HCL 5 MG/5ML IJ SOLN
INTRAMUSCULAR | Status: DC | PRN
  Administered 2017-10-30: 2 mg via INTRAVENOUS

## 2017-10-30 MED ORDER — ZOLPIDEM TARTRATE 5 MG PO TABS: 5.0000 mg | ORAL_TABLET | Freq: Every evening | ORAL | Status: DC | PRN

## 2017-10-30 MED ORDER — SODIUM CHLORIDE 0.9 % IV SOLN
INTRAVENOUS | Status: DC | PRN
  Administered 2017-10-30: 50 ug/min via INTRAVENOUS

## 2017-10-30 MED ORDER — SODIUM CHLORIDE 0.9 % IV SOLN
INTRAVENOUS | Status: DC
  Administered 2017-10-30 (×2): via INTRAVENOUS

## 2017-10-30 MED ORDER — ASPIRIN 81 MG PO CHEW
81.0000 mg | CHEWABLE_TABLET | Freq: Two times a day (BID) | ORAL | Status: DC
  Administered 2017-10-31 – 2017-11-02 (×5): 81 mg via ORAL
  Filled 2017-10-30 (×5): qty 1

## 2017-10-30 MED ORDER — METOCLOPRAMIDE HCL 10 MG PO TABS: 5.0000 mg | ORAL_TABLET | Freq: Three times a day (TID) | ORAL | Status: DC | PRN

## 2017-10-30 MED ORDER — OXYCODONE HCL 5 MG PO TABS
10.0000 mg | ORAL_TABLET | ORAL | Status: DC | PRN
  Administered 2017-10-30 – 2017-11-02 (×3): 10 mg via ORAL
  Filled 2017-10-30 (×2): qty 2

## 2017-10-30 MED ORDER — ACETAMINOPHEN 500 MG PO TABS
1000.0000 mg | ORAL_TABLET | Freq: Four times a day (QID) | ORAL | Status: AC
  Administered 2017-10-30 – 2017-10-31 (×4): 1000 mg via ORAL
  Filled 2017-10-30 (×4): qty 2

## 2017-10-30 MED ORDER — MAGNESIUM CITRATE PO SOLN
1.0000 | Freq: Once | ORAL | Status: DC | PRN
  Filled 2017-10-30 (×2): qty 296

## 2017-10-30 MED ORDER — CEFAZOLIN SODIUM-DEXTROSE 2-4 GM/100ML-% IV SOLN
2.0000 g | Freq: Four times a day (QID) | INTRAVENOUS | Status: AC
  Administered 2017-10-30 – 2017-10-31 (×3): 2 g via INTRAVENOUS
  Filled 2017-10-30 (×4): qty 100

## 2017-10-30 MED ORDER — DOCUSATE SODIUM 100 MG PO CAPS
100.0000 mg | ORAL_CAPSULE | Freq: Two times a day (BID) | ORAL | Status: DC
  Administered 2017-10-30 – 2017-11-02 (×6): 100 mg via ORAL
  Filled 2017-10-30 (×6): qty 1

## 2017-10-30 MED ORDER — METOCLOPRAMIDE HCL 5 MG/ML IJ SOLN: 5.0000 mg | Freq: Three times a day (TID) | INTRAMUSCULAR | Status: DC | PRN

## 2017-10-30 MED ORDER — ALUM & MAG HYDROXIDE-SIMETH 200-200-20 MG/5ML PO SUSP: 30.0000 mL | ORAL | Status: DC | PRN

## 2017-10-30 MED ORDER — ONDANSETRON HCL 4 MG/2ML IJ SOLN: 4.0000 mg | Freq: Four times a day (QID) | INTRAMUSCULAR | Status: DC | PRN

## 2017-10-30 MED ORDER — SENNOSIDES-DOCUSATE SODIUM 8.6-50 MG PO TABS: 1.0000 | ORAL_TABLET | Freq: Every evening | ORAL | Status: DC | PRN

## 2017-10-30 MED ORDER — METHOCARBAMOL 1000 MG/10ML IJ SOLN
500.0000 mg | Freq: Four times a day (QID) | INTRAVENOUS | Status: DC | PRN
  Filled 2017-10-30: qty 5

## 2017-10-30 MED ORDER — LACTATED RINGERS IV SOLN
INTRAVENOUS | Status: DC
  Administered 2017-10-30: 100 mL/h via INTRAVENOUS

## 2017-10-30 MED ORDER — ACETAMINOPHEN 325 MG PO TABS: 325.0000 mg | ORAL_TABLET | Freq: Four times a day (QID) | ORAL | Status: DC | PRN

## 2017-10-30 MED ORDER — CEFAZOLIN SODIUM-DEXTROSE 2-4 GM/100ML-% IV SOLN
2.0000 g | Freq: Once | INTRAVENOUS | Status: AC
  Administered 2017-10-30: 2 g via INTRAVENOUS

## 2017-10-30 MED ORDER — MIDAZOLAM HCL 2 MG/2ML IJ SOLN
INTRAMUSCULAR | Status: AC
  Filled 2017-10-30: qty 2

## 2017-10-30 MED ORDER — FAMOTIDINE 20 MG PO TABS
20.0000 mg | ORAL_TABLET | Freq: Once | ORAL | Status: AC
  Administered 2017-10-30: 20 mg via ORAL

## 2017-10-30 MED ORDER — BUPIVACAINE-EPINEPHRINE 0.25% -1:200000 IJ SOLN
INTRAMUSCULAR | Status: DC | PRN
  Administered 2017-10-30: 30 mL

## 2017-10-30 MED ORDER — SEVOFLURANE IN SOLN
RESPIRATORY_TRACT | Status: AC
  Filled 2017-10-30: qty 250

## 2017-10-30 MED ORDER — PROPOFOL 10 MG/ML IV BOLUS
INTRAVENOUS | Status: DC | PRN
  Administered 2017-10-30: 100 mg via INTRAVENOUS
  Administered 2017-10-30 (×2): 30 mg via INTRAVENOUS

## 2017-10-30 MED ORDER — GABAPENTIN 300 MG PO CAPS
300.0000 mg | ORAL_CAPSULE | Freq: Three times a day (TID) | ORAL | Status: DC
  Administered 2017-10-30 – 2017-11-02 (×9): 300 mg via ORAL
  Filled 2017-10-30 (×9): qty 1

## 2017-10-30 MED ORDER — DIPHENHYDRAMINE HCL 12.5 MG/5ML PO ELIX: 12.5000 mg | ORAL_SOLUTION | ORAL | Status: DC | PRN

## 2017-10-30 MED ORDER — PROPOFOL 500 MG/50ML IV EMUL
INTRAVENOUS | Status: DC | PRN
  Administered 2017-10-30: 100 ug/kg/min via INTRAVENOUS

## 2017-10-30 SURGICAL SUPPLY — 59 items
BLADE SAGITTAL AGGR TOOTH XLG (BLADE) ×3 IMPLANT
BNDG COHESIVE 6X5 TAN STRL LF (GAUZE/BANDAGES/DRESSINGS) ×9 IMPLANT
CANISTER SUCT 1200ML W/VALVE (MISCELLANEOUS) ×3 IMPLANT
CHLORAPREP W/TINT 26ML (MISCELLANEOUS) ×3 IMPLANT
CUP ACETAB VERSA DBL 28X58 DMI (Orthopedic Implant) ×3 IMPLANT
DRAPE C-ARM XRAY 36X54 (DRAPES) ×3 IMPLANT
DRAPE INCISE IOBAN 66X60 STRL (DRAPES) IMPLANT
DRAPE POUCH INSTRU U-SHP 10X18 (DRAPES) ×3 IMPLANT
DRAPE SHEET LG 3/4 BI-LAMINATE (DRAPES) ×9 IMPLANT
DRAPE TABLE BACK 80X90 (DRAPES) ×3 IMPLANT
DRESSING SURGICEL FIBRLLR 1X2 (HEMOSTASIS) ×2 IMPLANT
DRSG OPSITE POSTOP 4X8 (GAUZE/BANDAGES/DRESSINGS) ×6 IMPLANT
DRSG SURGICEL FIBRILLAR 1X2 (HEMOSTASIS) ×6
ELECT BLADE 6.5 EXT (BLADE) ×3 IMPLANT
ELECT REM PT RETURN 9FT ADLT (ELECTROSURGICAL) ×3
ELECTRODE REM PT RTRN 9FT ADLT (ELECTROSURGICAL) ×1 IMPLANT
GLOVE BIOGEL PI IND STRL 9 (GLOVE) IMPLANT
GLOVE BIOGEL PI INDICATOR 9 (GLOVE)
GLOVE SURG SYN 9.0  PF PI (GLOVE) ×8
GLOVE SURG SYN 9.0 PF PI (GLOVE) ×4 IMPLANT
GOWN SRG 2XL LVL 4 RGLN SLV (GOWNS) ×1 IMPLANT
GOWN STRL NON-REIN 2XL LVL4 (GOWNS) ×2
GOWN STRL REUS W/ TWL LRG LVL3 (GOWN DISPOSABLE) ×1 IMPLANT
GOWN STRL REUS W/TWL LRG LVL3 (GOWN DISPOSABLE) ×2
HEAD FEMORAL SZ XXL 28MM (Head) ×3 IMPLANT
HEMOVAC 400CC 10FR (MISCELLANEOUS) IMPLANT
HOLDER FOLEY CATH W/STRAP (MISCELLANEOUS) ×3 IMPLANT
HOOD PEEL AWAY FLYTE STAYCOOL (MISCELLANEOUS) ×3 IMPLANT
KIT PREVENA INCISION MGT 13 (CANNISTER) ×3 IMPLANT
MAT ABSORB  FLUID 56X50 GRAY (MISCELLANEOUS) ×2
MAT ABSORB FLUID 56X50 GRAY (MISCELLANEOUS) ×1 IMPLANT
NDL SAFETY ECLIPSE 18X1.5 (NEEDLE) ×1 IMPLANT
NEEDLE HYPO 18GX1.5 SHARP (NEEDLE) ×2
NEEDLE SPNL 18GX3.5 QUINCKE PK (NEEDLE) ×3 IMPLANT
NS IRRIG 1000ML POUR BTL (IV SOLUTION) ×3 IMPLANT
PACK HIP COMPR (MISCELLANEOUS) ×3 IMPLANT
PULSAVAC PLUS IRRIG FAN TIP (DISPOSABLE) ×3
SCALPEL PROTECTED #10 DISP (BLADE) ×6 IMPLANT
SOL .9 NS 3000ML IRR  AL (IV SOLUTION) ×2
SOL .9 NS 3000ML IRR UROMATIC (IV SOLUTION) ×1 IMPLANT
SOL PREP PVP 2OZ (MISCELLANEOUS) ×3
SOLUTION PREP PVP 2OZ (MISCELLANEOUS) ×1 IMPLANT
SPONGE DRAIN TRACH 4X4 STRL 2S (GAUZE/BANDAGES/DRESSINGS) ×3 IMPLANT
STAPLER SKIN PROX 35W (STAPLE) ×3 IMPLANT
STRAP SAFETY 5IN WIDE (MISCELLANEOUS) ×3 IMPLANT
SUT DVC 2 QUILL PDO  T11 36X36 (SUTURE) ×2
SUT DVC 2 QUILL PDO T11 36X36 (SUTURE) ×1 IMPLANT
SUT SILK 0 (SUTURE) ×2
SUT SILK 0 30XBRD TIE 6 (SUTURE) ×1 IMPLANT
SUT V-LOC 90 ABS DVC 3-0 CL (SUTURE) ×3 IMPLANT
SUT VIC AB 1 CT1 36 (SUTURE) ×3 IMPLANT
SYR 20CC LL (SYRINGE) ×3 IMPLANT
SYR 30ML LL (SYRINGE) ×3 IMPLANT
SYR BULB IRRIG 60ML STRL (SYRINGE) ×3 IMPLANT
TAPE MICROFOAM 4IN (TAPE) ×3 IMPLANT
TIP FAN IRRIG PULSAVAC PLUS (DISPOSABLE) ×1 IMPLANT
TOWEL OR 17X26 4PK STRL BLUE (TOWEL DISPOSABLE) ×3 IMPLANT
TRAY FOLEY MTR SLVR 16FR STAT (SET/KITS/TRAYS/PACK) ×3 IMPLANT
WND VAC CANISTER 500ML (MISCELLANEOUS) ×3 IMPLANT

## 2017-10-30 NOTE — Progress Notes (Addendum)
Pt admitted to unit from PACU. Pt is A&Ox4, he can flex both thighs. Foley and wound vac to right hip in place.  Pt oriented to room and plan of care. Bed in lowest position, call bell within reach.   Pt has appt at 3:15 today at the cancer center for radiation oncology. Transportation set up.   [place], Latricia Heft

## 2017-10-30 NOTE — Progress Notes (Signed)
15 minute call to floor. 

## 2017-10-30 NOTE — NC FL2 (Signed)
[place] MEDICAID FL2 LEVEL OF CARE SCREENING TOOL     IDENTIFICATION  Patient Name: Charles Austin Birthdate: 12-31-1955 Sex: male Admission Date (Current Location): 10/30/2017  [place] and [place] Number:  Chiropodist and Address:  [facility], [address] [place], [place] [address]      Provider Number: [id number]  Attending Physician Name and Address:  Kennedy Bucker, MD  Relative Name and Phone Number:       Current Level of Care: Hospital Recommended Level of Care: Skilled Nursing Facility Prior Approval Number:    Date Approved/Denied:   PASRR Number: [id number])  Discharge Plan: SNF    Current Diagnoses: Patient Active Problem List   Diagnosis Date Noted  . Heterotopic ossification of bone 10/30/2017  . Primary localized osteoarthritis of right hip 12/28/2015    Orientation RESPIRATION BLADDER Height & Weight     Self, Time, Situation, Place  Normal Continent Weight: 229 lb 6 oz (104 kg) Height:  6\' 2"  (188 cm)  BEHAVIORAL SYMPTOMS/MOOD NEUROLOGICAL BOWEL NUTRITION STATUS      Continent Diet(Diet: Regular )  AMBULATORY STATUS COMMUNICATION OF NEEDS Skin   Extensive Assist Verbally Surgical wounds, Wound Vac(Incision: Right Hip, Provena Wound Vac. )                       Personal Care Assistance Level of Assistance  Bathing, Feeding, Dressing Bathing Assistance: Limited assistance Feeding assistance: Independent Dressing Assistance: Limited assistance     Functional Limitations Info  Sight, Hearing, Speech Sight Info: Adequate Hearing Info: Adequate Speech Info: Adequate    SPECIAL CARE FACTORS FREQUENCY  PT (By licensed PT), OT (By licensed OT)     PT Frequency: (5) OT Frequency: (5)            Contractures      Additional Factors Info  Code Status, Allergies, Isolation Precautions Code Status Info: (Full Code. ) Allergies Info: (Latex)     Isolation Precautions Info: (MRSA Nasal Swab.  )     Current Medications (10/30/2017):  This is the current hospital active medication list Current Facility-Administered Medications  Medication Dose Route Frequency Provider Last Rate Last Dose  . 0.9 %  sodium chloride infusion   Intravenous Continuous Kennedy Bucker, MD 100 mL/hr at 10/30/17 1128    . acetaminophen (TYLENOL) tablet 1,000 mg  1,000 mg Oral Q6H Kennedy Bucker, MD      . Melene Muller ON 10/31/2017] acetaminophen (TYLENOL) tablet 325-650 mg  325-650 mg Oral Q6H PRN Kennedy Bucker, MD      . alum & mag hydroxide-simeth (MAALOX/MYLANTA) 200-200-20 MG/5ML suspension 30 mL  30 mL Oral Q4H PRN Kennedy Bucker, MD      . aspirin chewable tablet 81 mg  81 mg Oral BID Kennedy Bucker, MD      . bisacodyl (DULCOLAX) EC tablet 5 mg  5 mg Oral Daily PRN Kennedy Bucker, MD      . ceFAZolin (ANCEF) IVPB 2g/100 mL premix  2 g Intravenous Q6H Kennedy Bucker, MD      . diphenhydrAMINE (BENADRYL) 12.5 MG/5ML elixir 12.5-25 mg  12.5-25 mg Oral Q4H PRN Kennedy Bucker, MD      . docusate sodium (COLACE) capsule 100 mg  100 mg Oral BID Kennedy Bucker, MD      . gabapentin (NEURONTIN) capsule 300 mg  300 mg Oral TID Kennedy Bucker, MD      . HYDROmorphone (DILAUDID) injection 0.5-1 mg  0.5-1 mg Intravenous Q4H  PRN Kennedy Bucker, MD      . magnesium citrate solution 1 Bottle  1 Bottle Oral Once PRN Kennedy Bucker, MD      . menthol-cetylpyridinium (CEPACOL) lozenge 3 mg  1 lozenge Oral PRN Kennedy Bucker, MD       Or  . phenol (CHLORASEPTIC) mouth spray 1 spray  1 spray Mouth/Throat PRN Kennedy Bucker, MD      . methocarbamol (ROBAXIN) tablet 500 mg  500 mg Oral Q6H PRN Kennedy Bucker, MD       Or  . methocarbamol (ROBAXIN) 500 mg in dextrose 5 % 50 mL IVPB  500 mg Intravenous Q6H PRN Kennedy Bucker, MD      . metoCLOPramide (REGLAN) tablet 5-10 mg  5-10 mg Oral Q8H PRN Kennedy Bucker, MD       Or  . metoCLOPramide (REGLAN) injection 5-10 mg  5-10 mg Intravenous Q8H PRN Kennedy Bucker, MD      . ondansetron [facility])  tablet 4 mg  4 mg Oral Q6H PRN Kennedy Bucker, MD       Or  . ondansetron [facility]) injection 4 mg  4 mg Intravenous Q6H PRN Kennedy Bucker, MD      . oxyCODONE (Oxy IR/ROXICODONE) immediate release tablet 10-15 mg  10-15 mg Oral Q4H PRN Kennedy Bucker, MD      . oxyCODONE (Oxy IR/ROXICODONE) immediate release tablet 5 mg  5 mg Oral Q4H PRN Kennedy Bucker, MD      . senna-docusate (Senokot-S) tablet 1 tablet  1 tablet Oral QHS PRN Kennedy Bucker, MD      . traMADol Janean Sark) tablet 50 mg  50 mg Oral Q6H Kennedy Bucker, MD      . zolpidem (AMBIEN) tablet 5 mg  5 mg Oral QHS PRN,MR X 1 Kennedy Bucker, MD         Discharge Medications: Please see discharge summary for a list of discharge medications.  Relevant Imaging Results:  Relevant Lab Results:   Additional Information (SSN: [id number])  Blayn Whetsell, Darleen Crocker, LCSW

## 2017-10-30 NOTE — Progress Notes (Signed)
Physical Therapy Evaluation Patient Details Name: Charles Austin MRN: [id number] DOB: 09-Aug-1955 Today's Date: 10/30/2017   History of Present Illness  Pt is admitted s/p R THA anterior approach revision of hetertopic ossification on 10/30/2017. PMH of R knee arthoscopy, and R THA.   Clinical Impression  Pt is a pleasant 62 year old male who was admitted s/p R THA anterior approach. Pt performs bed mobility with Min A for RLE management. Able to tolerate various ther-ex for hip musculature without pain. Pt demonstrates deficits with strength, endurance, and mobility. Pt is not at his baseline. Would benefit from skilled PT to address above deficits and promote optimal return to PLOF. D/t time constraints with pt scheduling at cancer center unable to progress mobility further. Recommend nursing staff assist pt to chair this evening.     Follow Up Recommendations Home health PT    Equipment Recommendations  Rolling walker with 5" wheels    Recommendations for Other Services       Precautions / Restrictions Precautions Precautions: Anterior Hip Precaution Booklet Issued: No Restrictions Weight Bearing Restrictions: Yes RLE Weight Bearing: Weight bearing as tolerated      Mobility  Bed Mobility Overal bed mobility: Needs Assistance Bed Mobility: Supine to Sit;Sit to Supine     Supine to sit: Min assist Sit to supine: Min assist   General bed mobility comments: Pt requires Min A to manage RLE, is otherwise independent with bed mobility  Transfers                 General transfer comment: D/t pt scheduled appointment at cancer center unable to progress to transfers this date.  Ambulation/Gait                Stairs            Wheelchair Mobility    Modified Rankin (Stroke Patients Only)       Balance Overall balance assessment: Needs assistance Sitting-balance support: No upper extremity supported;Feet supported Sitting balance-Leahy Scale:  Fair Sitting balance - Comments: Pt demonstrates good trunk control.                                     Pertinent Vitals/Pain Pain Assessment: No/denies pain    Home Living Family/patient expects to be discharged to:: Private residence Living Arrangements: Spouse/significant other(Lives with girlfriend) Available Help at Discharge: Friend(s);Available PRN/intermittently Type of Home: House Home Access: Stairs to enter Entrance Stairs-Rails: Right;Left;Can reach both Entrance Stairs-Number of Steps: 3 Home Layout: One level Home Equipment: Walker - 4 wheels;Cane - single point;Grab bars - tub/shower      Prior Function Level of Independence: Independent         Comments: Per pt used [employer] for amb and works 2 jobs totaling 60 hrs per week. Plans to return to just one job.     Hand Dominance        Extremity/Trunk Assessment   Upper Extremity Assessment Upper Extremity Assessment: Overall WFL for tasks assessed    Lower Extremity Assessment Lower Extremity Assessment: RLE deficits/detail RLE Deficits / Details: Generalized weakness 3/5 RLE Sensation: WNL       Communication   Communication: No difficulties  Cognition Arousal/Alertness: Awake/alert Behavior During Therapy: WFL for tasks assessed/performed Overall Cognitive Status: Within Functional Limits for tasks assessed  General Comments: Follows commands consistantly      General Comments      Exercises Other Exercises Other Exercises: Supine AAROM RLE ankle pumps (AROM), SAQs, SLRs, hip ABD/ADD. All ther-ex performed x10 reps with CGA and min VC for technique. Other Exercises: Seated LAQs x10 reps with VC for technique   Assessment/Plan    PT Assessment Patient needs continued PT services  PT Problem List Decreased strength;Decreased activity tolerance;Decreased mobility       PT Treatment Interventions DME instruction;Gait  training;Stair training;Functional mobility training;Therapeutic activities;Therapeutic exercise;Balance training;Neuromuscular re-education;Patient/family education    PT Goals (Current goals can be found in the Care Plan section)  Acute Rehab PT Goals Patient Stated Goal: To go home and get back to my normal routine PT Goal Formulation: With patient Time For Goal Achievement: 11/13/17 Potential to Achieve Goals: Good Additional Goals Additional Goal #1: Pt will be able to perform bed mobility and transfers Mod I for improved ability to mobilize in the home.    Frequency BID   Barriers to discharge        Co-evaluation               AM-PAC PT "6 Clicks" Daily Activity  Outcome Measure Difficulty turning over in bed (including adjusting bedclothes, sheets and blankets)?: None Difficulty moving from lying on back to sitting on the side of the bed? : Unable Difficulty sitting down on and standing up from a chair with arms (e.g., wheelchair, bedside commode, etc,.)?: Unable Help needed moving to and from a bed to chair (including a wheelchair)?: A Lot Help needed walking in hospital room?: A Lot Help needed climbing 3-5 steps with a railing? : Total 6 Click Score: 11    End of Session   Activity Tolerance: Patient tolerated treatment well Patient left: in bed;with call bell/phone within reach;with bed alarm set;with SCD's reapplied Nurse Communication: Mobility status PT Visit Diagnosis: Unsteadiness on feet (R26.81);Muscle weakness (generalized) (M62.81)    Time: 9147-8295 PT Time Calculation (min) (ACUTE ONLY): 15 min   Charges:   PT Evaluation $PT Eval Low Complexity: 1 Low PT Treatments $Therapeutic Exercise: 8-22 mins        Arvilla Meres, SPT   Arvilla Meres 10/30/2017, 5:09 PM

## 2017-10-30 NOTE — Transfer of Care (Signed)
Immediate Anesthesia Transfer of Care Note  Patient: Charles Austin  Procedure(s) Performed: TOTAL HIP ARTHROPLASTY ANTERIOR APPROACH( THIS IS A REVISION OF HETERTOPIC OSSIFICATION) (Right )  Patient Location: PACU  Anesthesia Type:Spinal  Level of Consciousness: sedated  Airway & Oxygen Therapy: Patient Spontanous Breathing and Patient connected to face mask oxygen  Post-op Assessment: Report given to RN and Post -op Vital signs reviewed and stable  Post vital signs: Reviewed and stable  Last Vitals:  Vitals Value Taken Time  BP 90/58 10/30/2017  9:53 AM  Temp    Pulse 58 10/30/2017  9:54 AM  Resp 14 10/30/2017  9:54 AM  SpO2 100 % 10/30/2017  9:54 AM  Vitals shown include unvalidated device data.  Last Pain:  Vitals:   10/30/17 0614  TempSrc: Oral  PainSc: 7       Patients Stated Pain Goal: 1 (10/30/17 1610)  Complications: No apparent anesthesia complications

## 2017-10-30 NOTE — Anesthesia Procedure Notes (Signed)
Spinal  Patient location during procedure: OR Start time: 10/30/2017 7:20 AM End time: 10/30/2017 7:26 AM Staffing Resident/CRNA: Nelda Marseille, CRNA Performed: resident/CRNA  Preanesthetic Checklist Completed: patient identified, site marked, surgical consent, pre-op evaluation, timeout performed, IV checked, risks and benefits discussed and monitors and equipment checked Spinal Block Patient position: sitting Prep: Betadine Patient monitoring: heart rate, continuous pulse ox, blood pressure and cardiac monitor Approach: midline Location: L3-4 Injection technique: single-shot Needle Needle type: Whitacre and Introducer  Needle gauge: 25 G Needle length: 9 cm Assessment Sensory level: T10 Additional Notes Negative paresthesia. Negative blood return. Positive free-flowing CSF. Expiration date of kit checked and confirmed. Patient tolerated procedure well, without complications.

## 2017-10-30 NOTE — Op Note (Signed)
10/30/2017  12:02 PM  PATIENT:  Charles Austin  62 y.o. male  PRE-OPERATIVE DIAGNOSIS:  HETEROTOPIC OSSIFICATION, right total hip, subsidence femoral stem  POST-OPERATIVE DIAGNOSIS: Same  PROCEDURE:  Procedure(s): TOTAL HIP ARTHROPLASTY ANTERIOR APPROACH( THIS IS A REVISION OF HETERTOPIC OSSIFICATION) (Right) Revision of femoral head component with excision of heterotopic ossification.    SURGEON: Leitha Schuller, MD  ASSISTANTS: None  ANESTHESIA:   spinal  EBL:  Total I/O In: 800 [I.V.:800] Out: 1300 [Urine:600; Blood:700]  BLOOD ADMINISTERED:none  DRAINS: none   LOCAL MEDICATIONS USED:  MARCAINE     SPECIMEN:  No Specimen  DISPOSITION OF SPECIMEN:  N/A  COUNTS:  YES  TOURNIQUET:  * No tourniquets in log *  IMPLANTS: Medacta 58 dual mobility liner with  XXXL 28 mm metal femoral head  DICTATION: .Dragon Dictation patient was brought to the operating room and after adequate spinal anesthesia was obtained the patient was placed on the operative table to mid active attachment.  Left leg was placed on well-padded table.  After placing the right foot in the mid active attachment the hip was prepped and draped using the usual sterile fashion, appropriate patient identification and timeout procedures were completed.  A preop template x-ray was obtained.  The prior surgical incision incision was excised and it was a white thick scar.  Going down through the skin and subcutaneous tissue the ATFL fascia was incised and the tensor retracted laterally with exposure of the anterior capsule.  Capsulotomy was performed and deep retractor placed.  There is extensive scar tissue around the hip and this was excised and then more proximally bone that was the heterotopic ossification was removed with assistance with the fluoroscopy.  After adequate resection of the bone and clearing the area around the acetabulum of any heterotopic ossification the femoral head was separated from the trunnion and  the polyethylene ended up being cut to allow for extraction of the femoral head and in the bite dual mobility liner came out as well.  At this point the hip was irrigated with pulsatile lavage to remove any debris and bone wax was applied to the raw bone surfaces in the supra-acetabular area to help prevent recurrent heterotopic ossification.  With the subsidence present and shortened leg length a double XL was chosen and assembled impacted onto the stem and the hip reduced.  The wound was then infiltrated with half percent Sensorcaine with epinephrine 30 cc with fibrillar placed along the posterior capsule and medial neck to minimize postop bleeding.  The ATFL fascia was repaired using heavy Quill followed by 3-0V-LOC subcuticular closure skin staples and incisional wound VAC  PLAN OF CARE: Admit to inpatient   PATIENT DISPOSITION:  PACU - hemodynamically stable.

## 2017-10-30 NOTE — H&P (Signed)
Reviewed paper H+P, will be scanned into chart. No changes noted.  

## 2017-10-30 NOTE — Anesthesia Post-op Follow-up Note (Signed)
Anesthesia QCDR form completed.        

## 2017-10-30 NOTE — Anesthesia Preprocedure Evaluation (Signed)
Anesthesia Evaluation  Patient identified by MRN, date of birth, ID band Patient awake    Reviewed: Allergy & Precautions, NPO status , Patient's Chart, lab work & pertinent test results  History of Anesthesia Complications Negative for: history of anesthetic complications  Airway Mallampati: II       Dental   Pulmonary neg sleep apnea, neg COPD, Current Smoker,           Cardiovascular (-) hypertension(-) Past MI and (-) CHF (-) dysrhythmias (-) Valvular Problems/Murmurs     Neuro/Psych neg Seizures    GI/Hepatic Neg liver ROS, neg GERD  ,  Endo/Other  neg diabetes  Renal/GU negative Renal ROS     Musculoskeletal   Abdominal   Peds  Hematology   Anesthesia Other Findings   Reproductive/Obstetrics                             Anesthesia Physical Anesthesia Plan  ASA: II  Anesthesia Plan: Spinal   Post-op Pain Management:    Induction:   PONV Risk Score and Plan:   Airway Management Planned: Nasal Cannula  Additional Equipment:   Intra-op Plan:   Post-operative Plan:   Informed Consent: I have reviewed the patients History and Physical, chart, labs and discussed the procedure including the risks, benefits and alternatives for the proposed anesthesia with the patient or authorized representative who has indicated his/her understanding and acceptance.     Plan Discussed with:   Anesthesia Plan Comments:         Anesthesia Quick Evaluation

## 2017-10-31 ENCOUNTER — Ambulatory Visit: Payer: Medicare Other

## 2017-10-31 MED ORDER — PRO-STAT SUGAR FREE PO LIQD
30.0000 mL | Freq: Two times a day (BID) | ORAL | Status: DC
  Administered 2017-10-31 – 2017-11-02 (×4): 30 mL via ORAL

## 2017-10-31 MED ORDER — ENSURE ENLIVE PO LIQD
237.0000 mL | Freq: Two times a day (BID) | ORAL | Status: DC
  Administered 2017-10-31 – 2017-11-02 (×2): 237 mL via ORAL

## 2017-10-31 NOTE — Progress Notes (Signed)
   Subjective: 1 Day Post-Op Procedure(s) (LRB): TOTAL HIP ARTHROPLASTY ANTERIOR APPROACH( THIS IS A REVISION OF HETERTOPIC OSSIFICATION) (Right) Patient reports pain as 0 on 0-10 scale.   Patient is well, and has had no acute complaints or problems Denies any CP, SOB, ABD pain. We will continue therapy today.  Plan is to go Home after hospital stay.  Objective: Vital signs in last 24 hours: Temp:  [97.5 F (36.4 C)-99.1 F (37.3 C)] 98.7 F (37.1 C) (10/02 0257) Pulse Rate:  [55-80] 64 (10/02 0257) Resp:  [14-23] 16 (10/02 0257) BP: (90-179)/(58-94) 119/71 (10/02 0257) SpO2:  [97 %-100 %] 97 % (10/02 0257)  Intake/Output from previous day: 10/01 0701 - 10/02 0700 In: 2827.6 [P.O.:240; I.V.:2487.6; IV Piggyback:100] Out: 3050 [Urine:2150; Drains:200; Blood:700] Intake/Output this shift: No intake/output data recorded.  No results for input(s): HGB in the last 72 hours. No results for input(s): WBC, RBC, HCT, PLT in the last 72 hours. No results for input(s): NA, K, CL, CO2, BUN, CREATININE, GLUCOSE, CALCIUM in the last 72 hours. No results for input(s): LABPT, INR in the last 72 hours.  EXAM General - Patient is Alert, Appropriate and Oriented Extremity - Neurovascular intact Sensation intact distally Intact pulses distally Dorsiflexion/Plantar flexion intact No cellulitis present Compartment soft Dressing - dressing C/D/I and no drainage, wound vac with 400 cc drainage Motor Function - intact, moving foot and toes well on exam.   Past Medical History:  Diagnosis Date  . Osteoarthritis of right hip 2017   all over!!    Assessment/Plan:   1 Day Post-Op Procedure(s) (LRB): TOTAL HIP ARTHROPLASTY ANTERIOR APPROACH( THIS IS A REVISION OF HETERTOPIC OSSIFICATION) (Right) Active Problems:   Heterotopic ossification of bone  Estimated body mass index is 29.45 kg/m as calculated from the following:   Height as of this encounter: 6\' 2"  (1.88 m).   Weight as of this  encounter: 104 kg. Advance diet Up with therapy  Needs BM Labs stable Vital signs stable CM to assist with discharge Recheck labs in the am  DVT Prophylaxis - Aspirin, TED hose and SCDs Weight-Bearing as tolerated to right leg   T. Cranston Neighbor, PA-C [facility] 10/31/2017, 8:02 AM

## 2017-10-31 NOTE — Anesthesia Postprocedure Evaluation (Signed)
Anesthesia Post Note  Patient: Charles Austin  Procedure(s) Performed: TOTAL HIP ARTHROPLASTY ANTERIOR APPROACH( THIS IS A REVISION OF HETERTOPIC OSSIFICATION) (Right )  Patient location during evaluation: Nursing Unit Anesthesia Type: Spinal Level of consciousness: awake, awake and alert, oriented and patient cooperative Pain management: pain level controlled Respiratory status: spontaneous breathing, nonlabored ventilation and respiratory function stable Cardiovascular status: stable Postop Assessment: no headache, no backache, no apparent nausea or vomiting and patient able to bend at knees Anesthetic complications: no     Last Vitals:  Vitals:   10/30/17 2302 10/31/17 0257  BP: (!) 157/88 119/71  Pulse: 80 64  Resp: 16 16  Temp: 37.3 C 37.1 C  SpO2: 100% 97%    Last Pain:  Vitals:   10/31/17 0257  TempSrc: Oral  PainSc:                  Lyn Records

## 2017-10-31 NOTE — Progress Notes (Signed)
Clinical Social Worker (CSW) received SNF consult. PT is recommending home health. RN case manager aware of above. Please reconsult if future social work needs arise. CSW signing off.   Velena Keegan, LCSW [phone] 

## 2017-10-31 NOTE — Progress Notes (Signed)
Physical Therapy Treatment Patient Details Name: Charles Austin MRN: [id number] DOB: 1955/09/20 Today's Date: 10/31/2017    History of Present Illness Pt is admitted s/p R THA anterior approach revision of hetertopic ossification on 10/30/2017. PMH of R knee arthoscopy, and R THA.     PT Comments    Pt pleasant and agreeable to PT. Pt tolerates increased reps for ther-ex, still requires mod VC for technique. Pt has improved steadiness on his feet, with RW for UE support he lets RLE accept slight inc weight in standing. Pt demonstrates improved endurance with activity ambulating twice the distance of this morning. With ambulate requires intermittent VC for safe RW use. Will continue to progress strength/mobility as able with intentions to progress stairs (3 steps with B rails) tomorrow.   Follow Up Recommendations  Home health PT     Equipment Recommendations  Rolling walker with 5" wheels    Recommendations for Other Services       Precautions / Restrictions Precautions Precautions: Anterior Hip Precaution Booklet Issued: Yes (comment) Restrictions Weight Bearing Restrictions: Yes RLE Weight Bearing: Weight bearing as tolerated    Mobility  Bed Mobility Overal bed mobility: Needs Assistance Bed Mobility: Sit to Supine     Supine to sit: Min assist;Min guard Sit to supine: Min assist   General bed mobility comments: Min A to manage RLE into bed at the end of session, otherwise independent with bed mobility  Transfers Overall transfer level: Needs assistance Equipment used: Rolling walker (2 wheeled) Transfers: Sit to/from Stand Sit to Stand: Min guard         General transfer comment: Pt re-educated on safe RW use. During transfer has good ant translation. Pt still hesitant about putting any weight through RLE.  Ambulation/Gait Ambulation/Gait assistance: Min guard Gait Distance (Feet): 200 Feet Assistive device: Rolling walker (2 wheeled) Gait  Pattern/deviations: Step-to pattern Gait velocity: Walks 10 ft in 13.2 sec (0.758 ft/sec) Gait velocity interpretation: <1.31 ft/sec, indicative of household ambulator(indicative of risk for recurrent falls) General Gait Details: Pt amb with step-to-pattern, dec L step length, dec R heel contant, and ER of BLE. Required intermittent VC for safe RW use, without cueing uses RW like standard walker. Also had VC for ER of hip and upright posturing.   Stairs             Wheelchair Mobility    Modified Rankin (Stroke Patients Only)       Balance Overall balance assessment: Needs assistance Sitting-balance support: No upper extremity supported;Feet supported Sitting balance-Leahy Scale: Fair Sitting balance - Comments: Pt demonstrates good trunk control.   Standing balance support: Bilateral upper extremity supported Standing balance-Leahy Scale: Fair Standing balance comment: Increased steadiness noted this afternoon. Pt stands with feet flat on foot. Mild L trunk lean noted in standing.                            Cognition Arousal/Alertness: Awake/alert Behavior During Therapy: WFL for tasks assessed/performed Overall Cognitive Status: Within Functional Limits for tasks assessed                                 General Comments: Follows commands consistantly      Exercises Other Exercises Other Exercises: Supine AROM RLE ankle pumps, glute sets, quad sets, SAQs, SLRs (CGA), and hip ABD/ADD (CGA). All ther-ex x12 reps with VC for technique, particulary for  avoiding ER with SLRs and hip ABD/ADD. Other Exercises: Seated in recliner AROM ankle pumps, glute sets, quad sets, SLRs (Min A), hip ABD/ADD (CGA), LAQs and marching. All ther-ex perform x15 reps with VC for technique.     General Comments        Pertinent Vitals/Pain Pain Assessment: No/denies pain(Pt grimaces with movement)    Home Living                      Prior Function             PT Goals (current goals can now be found in the care plan section) Acute Rehab PT Goals Patient Stated Goal: To go home and get back to my normal routine PT Goal Formulation: With patient Time For Goal Achievement: 11/13/17 Potential to Achieve Goals: Good Progress towards PT goals: Progressing toward goals    Frequency    BID      PT Plan Current plan remains appropriate    Co-evaluation              AM-PAC PT "6 Clicks" Daily Activity  Outcome Measure  Difficulty turning over in bed (including adjusting bedclothes, sheets and blankets)?: None Difficulty moving from lying on back to sitting on the side of the bed? : Unable Difficulty sitting down on and standing up from a chair with arms (e.g., wheelchair, bedside commode, etc,.)?: Unable Help needed moving to and from a bed to chair (including a wheelchair)?: A Little Help needed walking in hospital room?: A Little Help needed climbing 3-5 steps with a railing? : A Little 6 Click Score: 15    End of Session Equipment Utilized During Treatment: Gait belt Activity Tolerance: Patient tolerated treatment well Patient left: in bed;with call bell/phone within reach;with bed alarm set;with SCD's reapplied Nurse Communication: Mobility status PT Visit Diagnosis: Unsteadiness on feet (R26.81);Muscle weakness (generalized) (M62.81)     Time: 1610-9604 PT Time Calculation (min) (ACUTE ONLY): 26 min  Charges:  $Gait Training: 8-22 mins $Therapeutic Exercise: 8-22 mins                     Arvilla Meres, SPT   Arvilla Meres 10/31/2017, 3:22 PM

## 2017-10-31 NOTE — Progress Notes (Signed)
Physical Therapy Treatment Patient Details Name: Charles Austin MRN: [id number] DOB: [birth year] Today's Date: 10/31/2017    History of Present Illness.  Pt is admitted s/p R THA anterior approach revision of hetertopic ossification on 10/30/2017. PMH of R knee arthoscopy, and R THA.     PT Comments    Pt pleasant and agreeable to PT. Resting BP measured 138/69. Pt educated on HEP including frequency and duration, visual aid provided. Pt performed there-ex independently, except SLRs and hip ABD/ADD needed CGA., VC for avoiding hip ER with SLRs and hip ABD/ADD. Pt demonstrated inc ability to mobilize in the bed requiring min A to initiate movement, but then CGA for the remainder. He is hesitant to put any weight through his RLE. Educated on [place] status and use of RW for UE support when standing or ambulating. Transfers with Min A and ambulates with CGA, +2 for management of equipment. Will continue to progress mobility and RLE strength as able.  Follow Up Recommendations  Home health PT     Equipment Recommendations  Rolling walker with 5" wheels    Recommendations for Other Services       Precautions / Restrictions Precautions Precautions: Anterior Hip Precaution Booklet Issued: Yes (comment) Restrictions Weight Bearing Restrictions: Yes RLE Weight Bearing: Weight bearing as tolerated    Mobility  Bed Mobility Overal bed mobility: Needs Assistance Bed Mobility: Supine to Sit     Supine to sit: Min assist;Min guard     General bed mobility comments: Pt required Min A to initiate moving RLE toward EOB, reduced to CGA for remainder of transition.  Transfers Overall transfer level: Needs assistance Equipment used: Rolling walker (2 wheeled) Transfers: Sit to/from Stand Sit to Stand: Min assist;+2 safety/equipment         General transfer comment: Pt educated on RW use. During transfer pt had fair ant. translation. He is very hesitent to put any weight through RLE. Uses  RW for UE support. Required VC for hand placement and encouragement.  Ambulation/Gait Ambulation/Gait assistance: Min guard;+2 safety/equipment Gait Distance (Feet): 100 Feet Assistive device: Rolling walker (2 wheeled) Gait Pattern/deviations: Step-to pattern Gait velocity: Walks 10 feet in 13.4 sec (0.746 ft/sec) Gait velocity interpretation: <1.31 ft/sec, indicative of household ambulator(indicative of risk for recurrent falls) General Gait Details: Pt amb with step-to-pattern, dec L step length, dec R heel contant, and ER of BLE.   Stairs             Wheelchair Mobility    Modified Rankin (Stroke Patients Only)       Balance Overall balance assessment: Needs assistance Sitting-balance support: No upper extremity supported;Feet supported Sitting balance-Leahy Scale: Fair Sitting balance - Comments: Pt demonstrates good trunk control.   Standing balance support: Bilateral upper extremity supported Standing balance-Leahy Scale: Fair Standing balance comment: Pt stands toe touch without heel contact, pt reports unsteadiness. He requires VC for heel contact, pt has inc steadiness, no apparent LOB at any point. Maintains WBing status without VC, uses RW for UE support.                            Cognition Arousal/Alertness: Awake/alert Behavior During Therapy: WFL for tasks assessed/performed Overall Cognitive Status: Within Functional Limits for tasks assessed                                 General Comments: Follows commands  consistantly      Exercises Other Exercises Other Exercises: Supine AROM RLE ankle pumps, glute sets, quad sets, SAQs, SLRs (CGA), and hip ABD/ADD (CGA). All ther-ex x12 reps with VC for technique, particulary for avoiding ER with SLRs and hip ABD/ADD.    General Comments        Pertinent Vitals/Pain Pain Assessment: No/denies pain    Home Living                      Prior Function            PT  Goals (current goals can now be found in the care plan section) Acute Rehab PT Goals Patient Stated Goal: To go home and get back to my normal routine PT Goal Formulation: With patient Time For Goal Achievement: 11/13/17 Potential to Achieve Goals: Good Progress towards PT goals: Progressing toward goals    Frequency    BID      PT Plan Current plan remains appropriate    Co-evaluation              AM-PAC PT "6 Clicks" Daily Activity  Outcome Measure  Difficulty turning over in bed (including adjusting bedclothes, sheets and blankets)?: None Difficulty moving from lying on back to sitting on the side of the bed? : Unable Difficulty sitting down on and standing up from a chair with arms (e.g., wheelchair, bedside commode, etc,.)?: Unable Help needed moving to and from a bed to chair (including a wheelchair)?: A Little Help needed walking in hospital room?: A Little Help needed climbing 3-5 steps with a railing? : A Lot 6 Click Score: 14    End of Session Equipment Utilized During Treatment: Gait belt Activity Tolerance: Patient tolerated treatment well(Pt commented about soreness at the end of the session.) Patient left: in chair;with call bell/phone within reach;with chair alarm set;with SCD's reapplied Nurse Communication: Mobility status PT Visit Diagnosis: Unsteadiness on feet (R26.81);Muscle weakness (generalized) (M62.81)     Time: 6045-4098 PT Time Calculation (min) (ACUTE ONLY): 29 min  Charges:                        Arvilla Meres, SPT   Arvilla Meres 10/31/2017, 12:39 PM

## 2017-11-01 ENCOUNTER — Ambulatory Visit: Payer: Medicare Other

## 2017-11-01 LAB — BASIC METABOLIC PANEL
ANION GAP: 7 (ref 5–15)
BUN: 8 mg/dL (ref 6–20)
CO2: 25 mmol/L (ref 22–32)
Calcium: 8.5 mg/dL — ABNORMAL LOW (ref 8.9–10.3)
Chloride: 105 mmol/L (ref 98–111)
Creatinine, Ser: 0.84 mg/dL (ref 0.61–1.24)
GFR calc Af Amer: >60 mL/min (ref >60)
GLUCOSE: 168 mg/dL — AB (ref 70–99)
POTASSIUM: 4.1 mmol/L (ref 3.5–5.1)
Sodium: 137 mmol/L (ref 135–145)

## 2017-11-01 LAB — CBC
HCT: 36.7 % — ABNORMAL LOW (ref 40.0–52.0)
Hemoglobin: 12.1 g/dL — ABNORMAL LOW (ref 13.0–18.0)
MCH: 26.6 pg (ref 26.0–34.0)
MCHC: 33.1 g/dL (ref 32.0–36.0)
MCV: 80.5 fL (ref 80.0–100.0)
PLATELETS: 222 10*3/uL (ref 150–440)
RBC: 4.56 MIL/uL (ref 4.40–5.90)
RDW: 15.9 % — AB (ref 11.5–14.5)
WBC: 8.2 10*3/uL (ref 3.8–10.6)

## 2017-11-01 MED ORDER — DOCUSATE SODIUM 100 MG PO CAPS: 100.0000 mg | ORAL_CAPSULE | Freq: Two times a day (BID) | ORAL | 0 refills | Status: AC

## 2017-11-01 MED ORDER — OXYCODONE HCL 5 MG PO TABS: 5.0000 mg | ORAL_TABLET | ORAL | 0 refills | Status: DC | PRN

## 2017-11-01 MED ORDER — ASPIRIN 81 MG PO CHEW: 81.0000 mg | CHEWABLE_TABLET | Freq: Two times a day (BID) | ORAL | 0 refills | Status: AC

## 2017-11-01 MED ORDER — METHOCARBAMOL 500 MG PO TABS: 500.0000 mg | ORAL_TABLET | Freq: Four times a day (QID) | ORAL | 0 refills | Status: DC | PRN

## 2017-11-01 NOTE — Progress Notes (Signed)
Physical Therapy Treatment Patient Details Name: Charles Austin MRN: [id number] DOB: 27-Mar-1955 Today's Date: 11/01/2017    History of Present Illness Pt is admitted s/p R THA anterior approach revision of hetertopic ossification on 10/30/2017. PMH of R knee arthoscopy, and R THA.     PT Comments    Pt sleeping, but awoken through voice. Agreeable to PT for bed exercises this session. Pt participates well with assist as needed for RLE. Pt fatigued from earlier session and being up most of the day and wishes to defer further mobility or ambulation. Continue PT to progress strength and all functional mobility to allow for an optimal, safe transition home.    Follow Up Recommendations  Home health PT     Equipment Recommendations  Rolling walker with 5" wheels    Recommendations for Other Services       Precautions / Restrictions Precautions Precautions: Anterior Hip Restrictions Weight Bearing Restrictions: Yes RLE Weight Bearing: Weight bearing as tolerated    Mobility  Bed Mobility               General bed mobility comments: Not tested; pt preferred to stay in bed and return to sleeping when finished  Transfers                    Ambulation/Gait                 Stairs             Wheelchair Mobility    Modified Rankin (Stroke Patients Only)       Balance                                            Cognition Arousal/Alertness: Lethargic Behavior During Therapy: WFL for tasks assessed/performed Overall Cognitive Status: Within Functional Limits for tasks assessed                                        Exercises Total Joint Exercises Ankle Circles/Pumps: Both;20 reps;Supine;AROM Quad Sets: Strengthening;Both;20 reps;Supine Gluteal Sets: Strengthening;Both;20 reps;Supine Short Arc Quad: AAROM;Both;20 reps;Supine Heel Slides: AROM;Both;20 reps;Supine Hip ABduction/ADduction: AAROM;20  reps;Supine;Both Straight Leg Raises: AAROM;Right;Both;10 reps;Supine(2 sets; A to maintain R in hooklying for L SLR 2 x 10 )    General Comments        Pertinent Vitals/Pain Pain Assessment: 0-10 Pain Score: 6 (With active movement) Pain Location: R hip Pain Intervention(s): Limited activity within patient's tolerance;Monitored during session    Home Living                      Prior Function            PT Goals (current goals can now be found in the care plan section) Progress towards PT goals: Progressing toward goals    Frequency    BID      PT Plan Current plan remains appropriate    Co-evaluation              AM-PAC PT "6 Clicks" Daily Activity  Outcome Measure  Difficulty turning over in bed (including adjusting bedclothes, sheets and blankets)?: None Difficulty moving from lying on back to sitting on the side of the bed? : A Little Difficulty sitting down on and  standing up from a chair with arms (e.g., wheelchair, bedside commode, etc,.)?: A Little Help needed moving to and from a bed to chair (including a wheelchair)?: A Little Help needed walking in hospital room?: A Little Help needed climbing 3-5 steps with a railing? : A Little 6 Click Score: 19    End of Session   Activity Tolerance: Patient tolerated treatment well Patient left: in bed;with call bell/phone within reach;Other (comment)(requested no bed alarm)   PT Visit Diagnosis: Unsteadiness on feet (R26.81);Muscle weakness (generalized) (M62.81)     Time: 1610-9604 PT Time Calculation (min) (ACUTE ONLY): 28 min  Charges:  $Therapeutic Exercise: 23-37 mins                      Scot Dock, PTA 11/01/2017, 4:55 PM

## 2017-11-01 NOTE — Discharge Summary (Addendum)
Physician Discharge Summary  Patient ID: Charles Austin MRN: [id number] DOB/AGE: 60-10-1955 60 y.o.  Admit date: 10/30/2017 Discharge date: 11/02/2017 Admission Diagnoses:  HETEROTOPIC OSSIFICATION   Discharge Diagnoses: Patient Active Problem List   Diagnosis Date Noted  . Heterotopic ossification of bone 10/30/2017  . Primary localized osteoarthritis of right hip 12/28/2015    Past Medical History:  Diagnosis Date  . Osteoarthritis of right hip 2017   all over!!     Transfusion: none   Consultants (if any):   Discharged Condition: Improved  Hospital Course: Charles Austin is an 62 y.o. male who was admitted 10/30/2017 with a diagnosis of right total hip pain with heterotopic ossification  and went to the operating room on 10/30/2017 and underwent the above named procedures.    Surgeries: Procedure(s): TOTAL HIP ARTHROPLASTY ANTERIOR APPROACH( THIS IS A REVISION OF HETERTOPIC OSSIFICATION) on 10/30/2017 Patient tolerated the surgery well. Taken to PACU where she was stabilized and then transferred to the orthopedic floor.  Started on aspirin, SCDs, TED hose.  Heels elevated on bed with rolled towels. No evidence of DVT. Negative [place]. Physical therapy started on day #1 for gait training and transfer. OT started day #1 for ADL and assisted devices.  Patient's foley was d/c on day #1. Patient's IVwas d/c on day #2.  On post op day #3 patient was stable and ready for discharge to home with home health PT.  Implants: Medacta 58 dual mobility liner with  XXXL 28 mm metal femoral head  He was given perioperative antibiotics:  Anti-infectives (From admission, onward)   Start     Dose/Rate Route Frequency Ordered Stop   10/30/17 1330  ceFAZolin (ANCEF) IVPB 2g/100 mL premix     2 g 200 mL/hr over 30 Minutes Intravenous Every 6 hours 10/30/17 1100 10/31/17 0228   10/30/17 0558  ceFAZolin (ANCEF) 2-4 GM/100ML-% IVPB    Note to Pharmacy:  Dellis Filbert   : cabinet override       10/30/17 0558 10/30/17 0727   10/30/17 0030  ceFAZolin (ANCEF) IVPB 2g/100 mL premix     2 g 200 mL/hr over 30 Minutes Intravenous  Once 10/30/17 0029 10/30/17 0737    .  He was given sequential compression devices, early ambulation, and aspirin for DVT prophylaxis.  He benefited maximally from the hospital stay and there were no complications.    Recent vital signs:  Vitals:   10/31/17 1631 10/31/17 2320  BP: (!) 152/88 (!) 167/76  Pulse: 70 80  Resp: 18 16  Temp: 98.2 F (36.8 C) 98.7 F (37.1 C)  SpO2: 99% 98%    Recent laboratory studies:  Lab Results  Component Value Date   HGB 12.1 (L) 11/01/2017   HGB 14.4 09/25/2017   HGB 12.7 (L) 12/29/2015   Lab Results  Component Value Date   WBC 8.2 11/01/2017   PLT 222 11/01/2017   Lab Results  Component Value Date   INR 1.07 09/25/2017   Lab Results  Component Value Date   NA 137 11/01/2017   K 4.1 11/01/2017   CL 105 11/01/2017   CO2 25 11/01/2017   BUN 8 11/01/2017   CREATININE 0.84 11/01/2017   GLUCOSE 168 (H) 11/01/2017    Discharge Medications:   Allergies as of 11/01/2017      Reactions   Latex    Skin bumps from powder in latex gloves      Medication List    TAKE these medications   acetaminophen  500 MG tablet Commonly known as:  TYLENOL Take 1,000 mg by mouth at bedtime.   aspirin 81 MG chewable tablet Chew 1 tablet (81 mg total) by mouth 2 (two) times daily.   docusate sodium 100 MG capsule Commonly known as:  COLACE Take 1 capsule (100 mg total) by mouth 2 (two) times daily.   methocarbamol 500 MG tablet Commonly known as:  ROBAXIN Take 1 tablet (500 mg total) by mouth every 6 (six) hours as needed for muscle spasms.   oxyCODONE 5 MG immediate release tablet Commonly known as:  Oxy IR/ROXICODONE Take 1-2 tablets (5-10 mg total) by mouth every 4 (four) hours as needed for moderate pain (pain score 4-6).       Diagnostic Studies: Dg Hip Operative Unilat W Or W/o Pelvis  Right  Result Date: 10/30/2017 CLINICAL DATA:  Status post revision of right total hip joint prosthesis placement. EXAM: OPERATIVE right HIP (WITH PELVIS IF PERFORMED) 2 VIEWS TECHNIQUE: Fluoroscopic spot image(s) were submitted for interpretation post-operatively. COMPARISON:  None. FINDINGS: An image which is labeled "pre right" and timed at 10:37 a.m. on October 30, 2017 reveals a hip joint prosthesis. The interface with the native bone appears normal where visualized. The distal stem of the femoral component is not included in the field of view. An accompanying image labeled "post right" exhibits the presence of a femoral prosthesis which appears to be appropriately positioned but the femoral stem is not included in the field of view. The time of the post right image is indicated as 1224 p.m. on today's date. The time stamp of both images is erupt erroneously as it is not yet 10 a.m. on 30 October 2017. IMPRESSION: Radiographic series of 2 fluoro spot images demonstrating a prosthetic right hip joint without obvious complication. The tip of the femoral stem is never visualized. Electronically Signed   By: David  [place] M.D.   On: 10/30/2017 09:53    Disposition:     Follow-up Information    Evon Slack, PA-C Follow up in 2 week(s).   Specialties:  Orthopedic Surgery, Emergency Medicine Contact information: [address] [place] [address] [phone]            Signed: Patience Musca 11/01/2017, 7:18 AM

## 2017-11-01 NOTE — Discharge Instructions (Signed)
ANTERIOR APPROACH TOTAL HIP REPLACEMENT POSTOPERATIVE DIRECTIONS   Hip Rehabilitation, Guidelines Following Surgery  The results of a hip operation are greatly improved after range of motion and muscle strengthening exercises. Follow all safety measures which are given to protect your hip. If any of these exercises cause increased pain or swelling in your joint, decrease the amount until you are comfortable again. Then slowly increase the exercises. Call your caregiver if you have problems or questions.   HOME CARE INSTRUCTIONS  Remove items at home which could result in a fall. This includes throw rugs or furniture in walking pathways.   ICE to the affected hip every three hours for 30 minutes at a time and then as needed for pain and swelling.  Continue to use ice on the hip for pain and swelling from surgery. You may notice swelling that will progress down to the foot and ankle.  This is normal after surgery.  Elevate the leg when you are not up walking on it.    Continue to use the breathing machine which will help keep your temperature down.  It is common for your temperature to cycle up and down following surgery, especially at night when you are not up moving around and exerting yourself.  The breathing machine keeps your lungs expanded and your temperature down.  Do not place pillow under knee, focus on keeping the knee straight while resting  DIET You may resume your previous home diet once your are discharged from the hospital.  DRESSING / WOUND CARE / SHOWERING Please remove provena negative pressure dressing on 11/09/2017 and apply honey comb dressing. Keep dressing clean and dry at all times.  ACTIVITY Walk with your walker as instructed. Use walker as long as suggested by your caregivers. Avoid periods of inactivity such as sitting longer than an hour when not asleep. This helps prevent blood clots.  You may resume a sexual relationship in one month or when given the OK by  your doctor.  You may return to work once you are cleared by your doctor.  Do not drive a car for 6 weeks or until released by you surgeon.  Do not drive while taking narcotics.  WEIGHT BEARING Weight bearing as tolerated. Use walker/cane as needed for at least 4 weeks post op.  POSTOPERATIVE CONSTIPATION PROTOCOL Constipation - defined medically as fewer than three stools per week and severe constipation as less than one stool per week.  One of the most common issues patients have following surgery is constipation.  Even if you have a regular bowel pattern at home, your normal regimen is likely to be disrupted due to multiple reasons following surgery.  Combination of anesthesia, postoperative narcotics, change in appetite and fluid intake all can affect your bowels.  In order to avoid complications following surgery, here are some recommendations in order to help you during your recovery period.  Colace (docusate) - Pick up an over-the-counter form of Colace or another stool softener and take twice a day as long as you are requiring postoperative pain medications.  Take with a full glass of water daily.  If you experience loose stools or diarrhea, hold the colace until you stool forms back up.  If your symptoms do not get better within 1 week or if they get worse, check with your doctor.  Dulcolax (bisacodyl) - Pick up over-the-counter and take as directed by the product packaging as needed to assist with the movement of your bowels.  Take with a full  glass of water.  Use this product as needed if not relieved by Colace only.  ° °MiraLax (polyethylene glycol) - Pick up over-the-counter to have on hand.  MiraLax is a solution that will increase the amount of water in your bowels to assist with bowel movements.  Take as directed and can mix with a glass of water, juice, soda, coffee, or tea.  Take if you go more than two days without a movement. °Do not use MiraLax more than once per day. Call your  doctor if you are still constipated or irregular after using this medication for 7 days in a row. ° °If you continue to have problems with postoperative constipation, please contact the office for further assistance and recommendations.  If you experience "the worst abdominal pain ever" or develop nausea or vomiting, please contact the office immediatly for further recommendations for treatment. ° °ITCHING ° If you experience itching with your medications, try taking only a single pain pill, or even half a pain pill at a time.  You can also use Benadryl over the counter for itching or also to help with sleep.  ° °TED HOSE STOCKINGS °Wear the elastic stockings on both legs for six weeks following surgery during the day but you may remove then at night for sleeping. ° °MEDICATIONS °See your medication summary on the “After Visit Summary” that the nursing staff will review with you prior to discharge.  You may have some home medications which will be placed on hold until you complete the course of blood thinner medication.  It is important for you to complete the blood thinner medication as prescribed by your surgeon.  Continue your approved medications as instructed at time of discharge. ° °PRECAUTIONS °If you experience chest pain or shortness of breath - call 911 immediately for transfer to the hospital emergency department.  °If you develop a fever greater that 101 F, purulent drainage from wound, increased redness or drainage from wound, foul odor from the wound/dressing, or calf pain - CONTACT YOUR SURGEON.   °                                                °FOLLOW-UP APPOINTMENTS °Make sure you keep all of your appointments after your operation with your surgeon and caregivers. You should call the office at the above phone number and make an appointment for approximately two weeks after the date of your surgery or on the date instructed by your surgeon outlined in the "After Visit Summary". ° °RANGE OF MOTION  AND STRENGTHENING EXERCISES  °These exercises are designed to help you keep full movement of your hip joint. Follow your caregiver's or physical therapist's instructions. Perform all exercises about fifteen times, three times per day or as directed. Exercise both hips, even if you have had only one joint replacement. These exercises can be done on a training (exercise) mat, on the floor, on a table or on a bed. Use whatever works the best and is most comfortable for you. Use music or television while you are exercising so that the exercises are a pleasant break in your day. This will make your life better with the exercises acting as a break in routine you can look forward to.  °Lying on your back, slowly slide your foot toward your buttocks, raising your knee up off the floor. Then slowly   slide your foot back down until your leg is straight again.  °Lying on your back spread your legs as far apart as you can without causing discomfort.  °Lying on your side, raise your upper leg and foot straight up from the floor as far as is comfortable. Slowly lower the leg and repeat.  °Lying on your back, tighten up the muscle in the front of your thigh (quadriceps muscles). You can do this by keeping your leg straight and trying to raise your heel off the floor. This helps strengthen the largest muscle supporting your knee.  °Lying on your back, tighten up the muscles of your buttocks both with the legs straight and with the knee bent at a comfortable angle while keeping your heel on the floor.  ° °IF YOU ARE TRANSFERRED TO A SKILLED REHAB FACILITY °If the patient is transferred to a skilled rehab facility following release from the hospital, a list of the current medications will be sent to the facility for the patient to continue.  When discharged from the skilled rehab facility, please have the facility set up the patient's Home Health Physical Therapy prior to being released. Also, the skilled facility will be responsible  for providing the patient with their medications at time of release from the facility to include their pain medication, the muscle relaxants, and their blood thinner medication. If the patient is still at the rehab facility at time of the two week follow up appointment, the skilled rehab facility will also need to assist the patient in arranging follow up appointment in our office and any transportation needs. ° °MAKE SURE YOU:  °Understand these instructions.  °Get help right away if you are not doing well or get worse.  ° ° °Pick up stool softner and laxative for home use following surgery while on pain medications. °Continue to use ice for pain and swelling after surgery. °Do not use any lotions or creams on the incision until instructed by your surgeon. ° ° °

## 2017-11-01 NOTE — Progress Notes (Signed)
   Subjective: 2 Days Post-Op Procedure(s) (LRB): TOTAL HIP ARTHROPLASTY ANTERIOR APPROACH( THIS IS A REVISION OF HETERTOPIC OSSIFICATION) (Right) Patient reports pain as mild.   Patient is well, and has had no acute complaints or problems Denies any CP, SOB, ABD pain. We will continue therapy today.  Plan is to go Home after hospital stay.  Objective: Vital signs in last 24 hours: Temp:  [98.2 F (36.8 C)-98.7 F (37.1 C)] 98.7 F (37.1 C) (10/02 2320) Pulse Rate:  [67-80] 80 (10/02 2320) Resp:  [16-18] 16 (10/02 2320) BP: (137-167)/(66-88) 167/76 (10/02 2320) SpO2:  [98 %-99 %] 98 % (10/02 2320)  Intake/Output from previous day: 10/02 0701 - 10/03 0700 In: 809.4 [P.O.:480; I.V.:329.4] Out: 1550 [Urine:1450; Drains:100] Intake/Output this shift: No intake/output data recorded.  Recent Labs    11/01/17 0408  HGB 12.1*   Recent Labs    11/01/17 0408  WBC 8.2  RBC 4.56  HCT 36.7*  PLT 222   Recent Labs    11/01/17 0408  NA 137  K 4.1  CL 105  CO2 25  BUN 8  CREATININE 0.84  GLUCOSE 168*  CALCIUM 8.5*   No results for input(s): LABPT, INR in the last 72 hours.  EXAM General - Patient is Alert, Appropriate and Oriented Extremity - Neurovascular intact Sensation intact distally Intact pulses distally Dorsiflexion/Plantar flexion intact No cellulitis present Compartment soft Dressing - dressing C/D/I and no drainage, wound vac with 425 cc drainage Motor Function - intact, moving foot and toes well on exam.   Past Medical History:  Diagnosis Date  . Osteoarthritis of right hip 2017   all over!!    Assessment/Plan:   2 Days Post-Op Procedure(s) (LRB): TOTAL HIP ARTHROPLASTY ANTERIOR APPROACH( THIS IS A REVISION OF HETERTOPIC OSSIFICATION) (Right) Active Problems:   Heterotopic ossification of bone  Estimated body mass index is 29.45 kg/m as calculated from the following:   Height as of this encounter: 6\' 2"  (1.88 m).   Weight as of this  encounter: 104 kg. Advance diet Up with therapy  Labs stable Vital signs stable CM to assist with discharge to home with HHPT.  Discharge home Friday  DVT Prophylaxis - Aspirin, TED hose and SCDs Weight-Bearing as tolerated to right leg   T. Cranston Neighbor, PA-C [facility] 11/01/2017, 7:13 AM

## 2017-11-01 NOTE — Progress Notes (Signed)
Physical Therapy Treatment Patient Details Name: Charles Austin MRN: [id number] DOB: Dec 07, 1955 Today's Date: 11/01/2017    History of Present Illness Pt is admitted s/p R THA anterior approach revision of hetertopic ossification on 10/30/2017. PMH of R knee arthoscopy, and R THA.     PT Comments    Pt making excellent progress with therapy on this date. He was able to successfully ambulate a full lap around RN station as well as complete stair training. He performs all exercises as instructed by therapist. Will continue to progress ambulation distance and strengthening at follow-up sessions. Pt has met all PT barriers for discharge and is safe to return home when medically appropriate. Pt will benefit from PT services to address deficits in strength, balance, and mobility in order to return to full function at home.     Follow Up Recommendations  Home health PT     Equipment Recommendations  Rolling walker with 5" wheels    Recommendations for Other Services       Precautions / Restrictions Precautions Precautions: Anterior Hip Precaution Booklet Issued: Yes (comment) Restrictions Weight Bearing Restrictions: Yes RLE Weight Bearing: Weight bearing as tolerated    Mobility  Bed Mobility Overal bed mobility: Needs Assistance Bed Mobility: Supine to Sit     Supine to sit: Supervision     General bed mobility comments: Pt demonstrates fair speed and sequencing. HOB elevated and use of bed rails. Mild increase in right hip pain  Transfers Overall transfer level: Needs assistance Equipment used: Rolling walker (2 wheeled) Transfers: Sit to/from Stand Sit to Stand: Min guard         General transfer comment: Pt demonstrates safe hand placement during transfers today. Bed is elevated to assist pt with transfer. Once standing pt is steady and stable with UE support on walker  Ambulation/Gait Ambulation/Gait assistance: Min guard Gait Distance (Feet): 250 Feet Assistive  device: Rolling walker (2 wheeled) Gait Pattern/deviations: Step-through pattern     General Gait Details: Pt is able to complete a full lap around RN station and also walk into rehab gym and back. He demonstrates step-through pattern but decreased stance time on RLE. Pt ambulates with bilateral toe out. Mild increase in hip pain with ambulation. Gait speed is [facility] for limited community mobility   Stairs Stairs: Yes Stairs assistance: Min guard Stair Management: Two rails;Step to pattern Number of Stairs: 4 General stair comments: Pt educated on proper sequencing with stairs. He demonstrates good stability and safety with stairs   Wheelchair Mobility    Modified Rankin (Stroke Patients Only)       Balance Overall balance assessment: Needs assistance Sitting-balance support: No upper extremity supported;Feet supported Sitting balance-Leahy Scale: Fair     Standing balance support: Bilateral upper extremity supported Standing balance-Leahy Scale: Fair                              Cognition Arousal/Alertness: Awake/alert Behavior During Therapy: WFL for tasks assessed/performed Overall Cognitive Status: Within Functional Limits for tasks assessed                                 General Comments: Follows commands consistantly      Exercises Total Joint Exercises Ankle Circles/Pumps: Both;10 reps Quad Sets: Both;10 reps Gluteal Sets: Both;10 reps Towel Squeeze: Both;10 reps Hip ABduction/ADduction: Right;10 reps Straight Leg Raises: Right;10 reps Long Arc  Quad: Right;10 reps Knee Flexion: Right;10 reps    General Comments        Pertinent Vitals/Pain Pain Assessment: 0-10 Pain Score: 3  Pain Location: R hip Pain Descriptors / Indicators: Operative site guarding Pain Intervention(s): Monitored during session;Premedicated before session    Home Living                      Prior Function            PT Goals (current goals  can now be found in the care plan section) Acute Rehab PT Goals Patient Stated Goal: To go home and get back to my normal routine PT Goal Formulation: With patient Time For Goal Achievement: 11/13/17 Potential to Achieve Goals: Good Progress towards PT goals: Progressing toward goals    Frequency    BID      PT Plan Current plan remains appropriate    Co-evaluation              AM-PAC PT "6 Clicks" Daily Activity  Outcome Measure  Difficulty turning over in bed (including adjusting bedclothes, sheets and blankets)?: None Difficulty moving from lying on back to sitting on the side of the bed? : A Little Difficulty sitting down on and standing up from a chair with arms (e.g., wheelchair, bedside commode, etc,.)?: A Little Help needed moving to and from a bed to chair (including a wheelchair)?: None Help needed walking in hospital room?: A Little Help needed climbing 3-5 steps with a railing? : A Little 6 Click Score: 20    End of Session Equipment Utilized During Treatment: Gait belt Activity Tolerance: Patient tolerated treatment well Patient left: with call bell/phone within reach;with SCD's reapplied;in chair;with chair alarm set Nurse Communication: Other (comment)(CNA notified pt needs more ice for hip pack) PT Visit Diagnosis: Unsteadiness on feet (R26.81);Muscle weakness (generalized) (M62.81)     Time: 6701-4103 PT Time Calculation (min) (ACUTE ONLY): 29 min  Charges:  $Gait Training: 8-22 mins $Therapeutic Exercise: 8-22 mins                     Bonny Egger D Malayna Noori PT, DPT, GCS    Lillyonna Armstead 11/01/2017, 11:08 AM

## 2017-11-01 NOTE — Progress Notes (Signed)
Wound vac cartridge changed.

## 2017-11-02 NOTE — Care Management Note (Signed)
Case Management Note  Patient Details  Name: AVONDRE RICHENS MRN: [id number] Date of Birth: 03/25/1955  Subjective/Objective:    Met with patient at bedside to discuss transitions of care. He lives with a friend. She will be his caregiver. He will need a walker. Patient declined a BSC. Ordered from [place] with [facility]. Offered a list of home care agencies. Referral to [facility] for HHPT. [facility] for anticoagulant.   Action/Plan: Discharging today. [facility] for PT. Walker from [facility]   Expected Discharge Date:  11/02/17               Expected Discharge Plan:  [place]  In-House Referral:     Discharge planning Services  CM Consult  Post Acute Care Choice:  Durable Medical Equipment, Home Health Choice offered to:  Patient  DME Arranged:  Walker rolling DME Agency:  [place]:  [facility] [place] Agency:  [facility] ([facility]  Status of Service:  Completed, signed off  If discussed at [employer] of Stay Meetings, dates discussed:    Additional Comments:  Jolly Mango, RN 11/02/2017, 9:21 AM

## 2017-11-02 NOTE — Progress Notes (Signed)
Physical Therapy Treatment Patient Details Name: Charles Austin MRN: [id number] DOB: Feb 21, 1955 Today's Date: 11/02/2017    History of Present Illness Pt is admitted s/p R THA anterior approach revision of hetertopic ossification on 10/30/2017. PMH of R knee arthoscopy, and R THA.     PT Comments    Pt progressing well t/o all areas of rehab with improved basic bed mobility, transfers, and ambulation. Pt requiring no physical assist for bed mobiltiy or sit to stand from toilet/bed and only mild increased effort noted. Ambulating around the nursing station pt initially performing step-to gait with discontinuous rolling of RW, however with VC pt able to complete the rest of the loop with improved sequencing, step through gait, and WBing tolerance through R LE. Assisted with transfer to toilet but no assistance required after bowel movement. Pt continues to remain appropriate for home with HHPT upon d/c.   Follow Up Recommendations  Home health PT     Equipment Recommendations  Rolling walker with 5" wheels    Recommendations for Other Services       Precautions / Restrictions Precautions Precautions: Anterior Hip Precaution Booklet Issued: Yes (comment) Restrictions Weight Bearing Restrictions: Yes RLE Weight Bearing: Weight bearing as tolerated    Mobility  Bed Mobility Overal bed mobility: Modified Independent Bed Mobility: Supine to Sit     Supine to sit: Modified independent (Device/Increase time)     General bed mobility comments: no physical assist required, pt able to perform basic bed mobility with increased time and effort   Transfers Overall transfer level: Modified independent Equipment used: Rolling walker (2 wheeled) Transfers: Sit to/from Stand Sit to Stand: Modified independent (Device/Increase time)         General transfer comment: pt with good effort performing sit to stand from bed and toilet with no physical assist only increased effort noted, no LOB  upon standing overall steady  Ambulation/Gait Ambulation/Gait assistance: Min guard Gait Distance (Feet): 200 Feet Assistive device: Rolling walker (2 wheeled)       General Gait Details: Pt with good effort during 200 feet of ambulation around the nursing station and to the bathroom. Initially performing step-to gait but with VC pt able to consistently perform step through gait with continuous rolling of RW. Pt continues to ambulate with toe out B. Decrease in back pain noted t/o ambulation.    Stairs             Wheelchair Mobility    Modified Rankin (Stroke Patients Only)       Balance Overall balance assessment: Needs assistance Sitting-balance support: No upper extremity supported;Feet supported Sitting balance-Leahy Scale: Good     Standing balance support: Single extremity supported Standing balance-Leahy Scale: Fair                              Cognition Arousal/Alertness: Awake/alert Behavior During Therapy: WFL for tasks assessed/performed Overall Cognitive Status: Within Functional Limits for tasks assessed                                        Exercises Total Joint Exercises Ankle Circles/Pumps: AROM;Both;20 reps;Supine Quad Sets: AROM;Strengthening;Right;20 reps;Supine Gluteal Sets: AROM;Strengthening;Right;20 reps;Supine Short Arc Quad: AROM;Strengthening;Right;20 reps;Supine Heel Slides: AROM;Strengthening;Right;20 reps;Supine Hip ABduction/ADduction: AROM;Strengthening;Right;20 reps;Supine    General Comments        Pertinent Vitals/Pain Pain Assessment: 0-10 Pain  Score: 0-No pain(6/10 in low back ) Pain Location: R hip Pain Intervention(s): Limited activity within patient's tolerance;Monitored during session;Repositioned;Premedicated before session    Home Living                      Prior Function            PT Goals (current goals can now be found in the care plan section) Progress towards  PT goals: Progressing toward goals    Frequency    BID      PT Plan Current plan remains appropriate    Co-evaluation              AM-PAC PT "6 Clicks" Daily Activity  Outcome Measure  Difficulty turning over in bed (including adjusting bedclothes, sheets and blankets)?: None Difficulty moving from lying on back to sitting on the side of the bed? : None Difficulty sitting down on and standing up from a chair with arms (e.g., wheelchair, bedside commode, etc,.)?: None Help needed moving to and from a bed to chair (including a wheelchair)?: A Little Help needed walking in hospital room?: A Little Help needed climbing 3-5 steps with a railing? : A Little 6 Click Score: 21    End of Session Equipment Utilized During Treatment: Gait belt Activity Tolerance: Patient tolerated treatment well Patient left: in chair;with chair alarm set;with call bell/phone within reach;with SCD's reapplied Nurse Communication: Mobility status(bowel movement) PT Visit Diagnosis: Unsteadiness on feet (R26.81);Muscle weakness (generalized) (M62.81)     Time: 1610-9604 PT Time Calculation (min) (ACUTE ONLY): 23 min  Charges:                        Mickel Duhamel, SPT 11/02/2017, 9:02 AM

## 2017-11-02 NOTE — Progress Notes (Signed)
   Subjective: 3 Days Post-Op Procedure(s) (LRB): TOTAL HIP ARTHROPLASTY ANTERIOR APPROACH( THIS IS A REVISION OF HETERTOPIC OSSIFICATION) (Right) Patient reports pain as mild.   Patient is well, and has had no acute complaints or problems Denies any CP, SOB, ABD pain. We will continue therapy today.  Plan is to go Home after hospital stay.  Objective: Vital signs in last 24 hours: Temp:  [98.6 F (37 C)-98.7 F (37.1 C)] 98.7 F (37.1 C) (10/04 0731) Pulse Rate:  [68-76] 68 (10/04 0731) Resp:  [16] 16 (10/03 2303) BP: (113-146)/(74-82) 146/82 (10/04 0731) SpO2:  [97 %-100 %] 98 % (10/04 0731)  Intake/Output from previous day: 10/03 0701 - 10/04 0700 In: 1080 [P.O.:1080] Out: 2900 [Urine:2900] Intake/Output this shift: No intake/output data recorded.  Recent Labs    11/01/17 0408  HGB 12.1*   Recent Labs    11/01/17 0408  WBC 8.2  RBC 4.56  HCT 36.7*  PLT 222   Recent Labs    11/01/17 0408  NA 137  K 4.1  CL 105  CO2 25  BUN 8  CREATININE 0.84  GLUCOSE 168*  CALCIUM 8.5*   No results for input(s): LABPT, INR in the last 72 hours.  EXAM General - Patient is Alert, Appropriate and Oriented Extremity - Neurovascular intact Sensation intact distally Intact pulses distally Dorsiflexion/Plantar flexion intact No cellulitis present Compartment soft Dressing - dressing C/D/I and no drainage, wound vac with 425 cc drainage Motor Function - intact, moving foot and toes well on exam.   Past Medical History:  Diagnosis Date  . Osteoarthritis of right hip 2017   all over!!    Assessment/Plan:   3 Days Post-Op Procedure(s) (LRB): TOTAL HIP ARTHROPLASTY ANTERIOR APPROACH( THIS IS A REVISION OF HETERTOPIC OSSIFICATION) (Right) Active Problems:   Heterotopic ossification of bone  Estimated body mass index is 29.45 kg/m as calculated from the following:   Height as of this encounter: 6\' 2"  (1.88 m).   Weight as of this encounter: 104 kg. Advance  diet Up with therapy  Labs stable Vital signs stable Discharge home today with provena  DVT Prophylaxis - Aspirin, TED hose and SCDs Weight-Bearing as tolerated to right leg   T. Cranston Neighbor, PA-C [facility] 11/02/2017, 7:56 AM

## 2017-11-02 NOTE — Progress Notes (Signed)
Patient is being discharged home with family. IV removed with cath intact. Reviewed scripts, last dose taken, and meds. Signed scripts sent with patient. Changed wound vac over to prevena. Sent booklet with patient. Allowed time for questions.

## 2017-11-02 NOTE — Plan of Care (Signed)

## 2017-11-02 NOTE — Care Management Important Message (Signed)
Important Message  Patient Details  Name: Charles Austin MRN: [id number] Date of Birth: October 08, 1955   Medicare Important Message Given:  Yes    Olegario Messier A Anik Wesch 11/02/2017, 11:33 AM

## 2017-11-30 ENCOUNTER — Ambulatory Visit: Payer: Medicare Other | Attending: Radiation Oncology | Admitting: Radiation Oncology

## 2018-05-11 IMAGING — CT CT ABD-PELV W/ CM
2 of 5 series · 16 of 46 positions shown, 18 images · IV contrast (APPLIED)
Comparison: None.

CLINICAL DATA: Fall with left-sided rib pain and left upper
quadrant abdominal pain.

EXAM:
CT ABDOMEN AND PELVIS WITH CONTRAST
TECHNIQUE: Multidetector CT imaging of the abdomen and pelvis was performed
using the standard protocol following bolus administration of
intravenous contrast.
CONTRAST:  100mL XRIXN8-MCC IOPAMIDOL (XRIXN8-MCC) INJECTION 61%

[Series 2: axial st · axial · 0.82mm/px · z∈[-597,-97]mm · 13 of 112 slices shown, 15 images]
[im 6/112  soft-tissue]
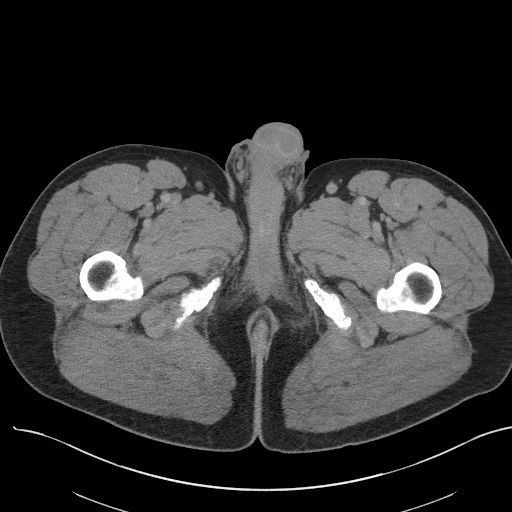
[im 6/112  bone]
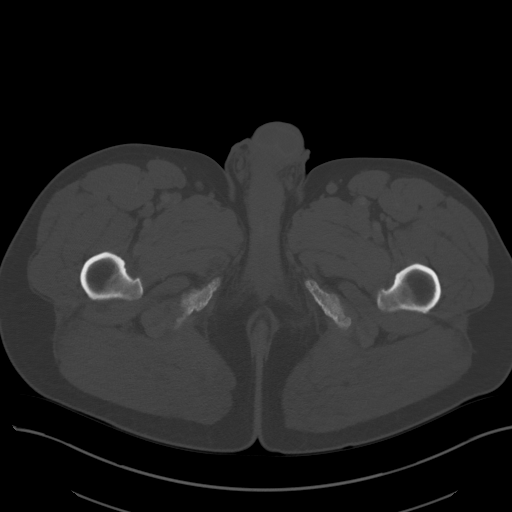
[im 18/112  soft-tissue]
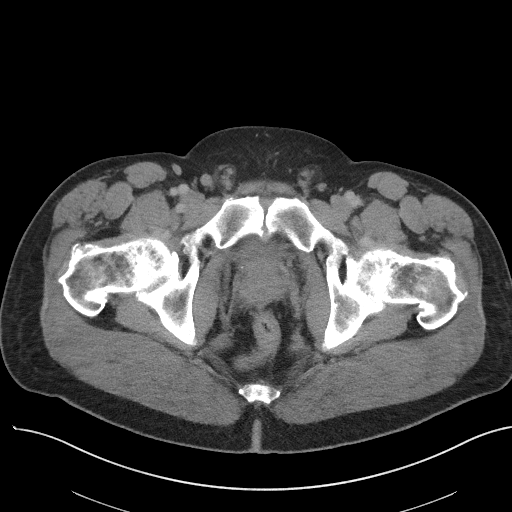
[im 24/112  soft-tissue]
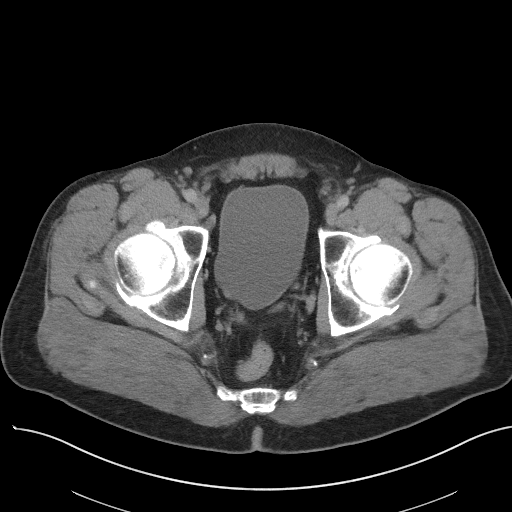
[im 30/112  soft-tissue]
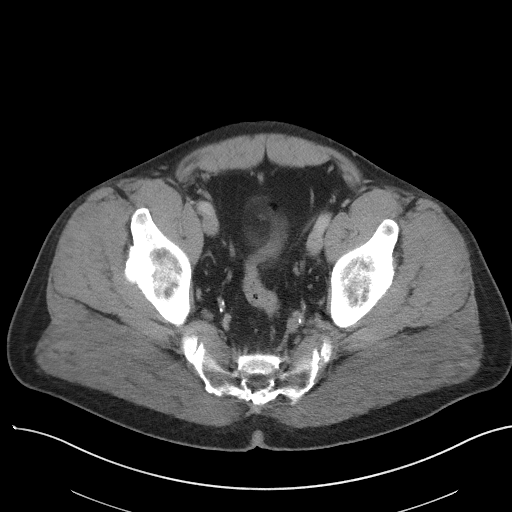
[im 41/112  soft-tissue]
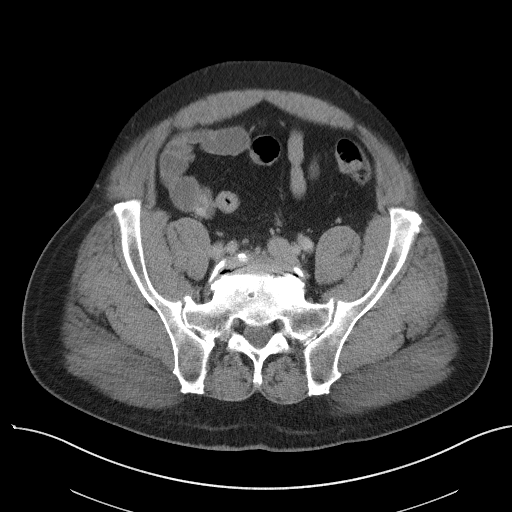
[im 47/112  soft-tissue]
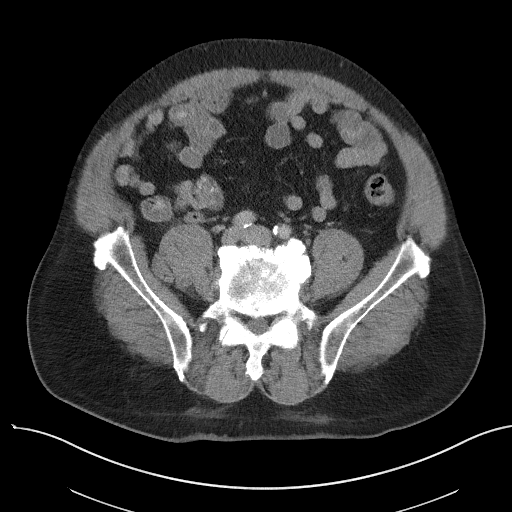
[im 59/112  soft-tissue]
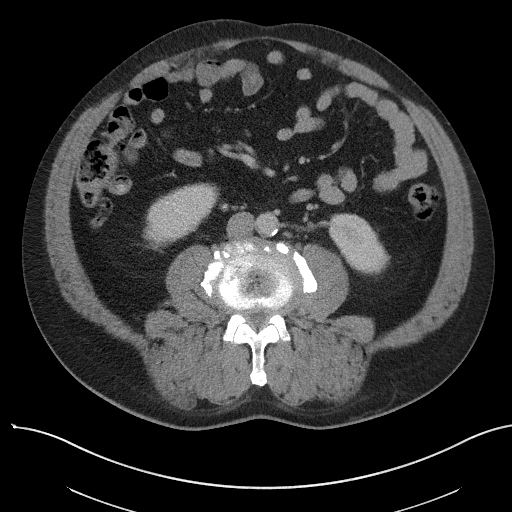
[im 65/112  soft-tissue]
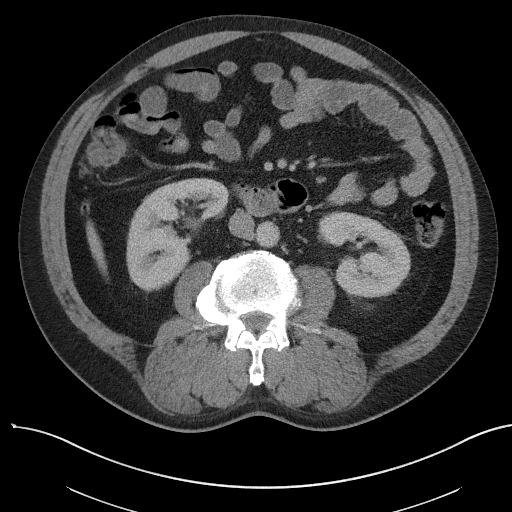
[im 71/112  soft-tissue]
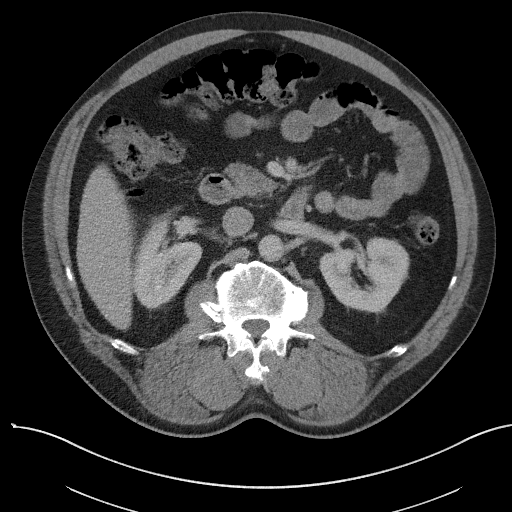
[im 71/112  bone]
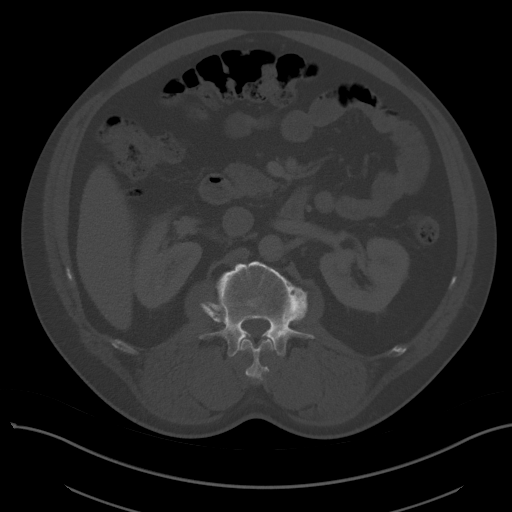
[im 82/112  soft-tissue]
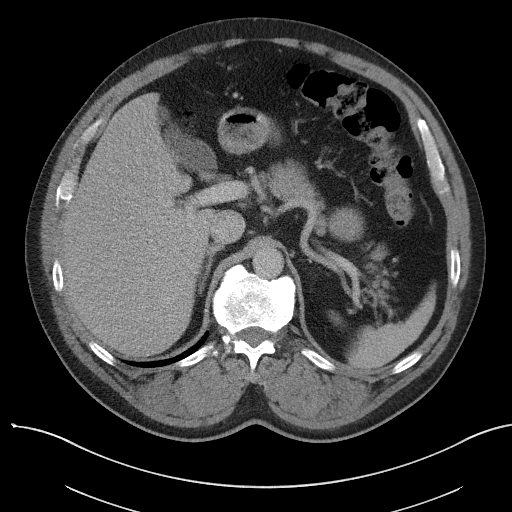
[im 88/112  soft-tissue]
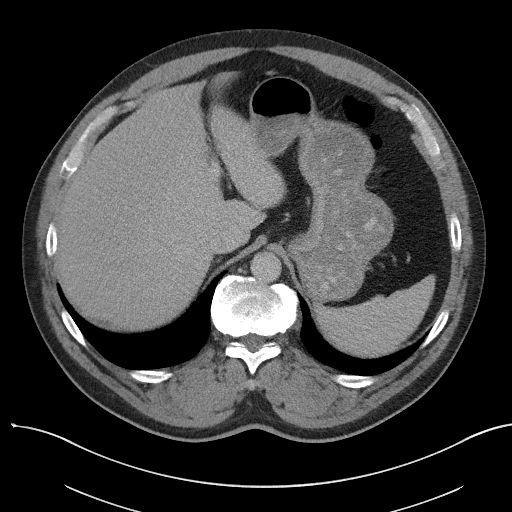
[im 94/112  soft-tissue]
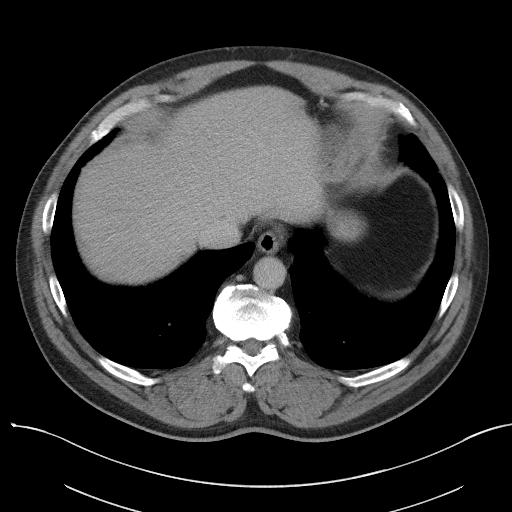
[im 106/112  soft-tissue]
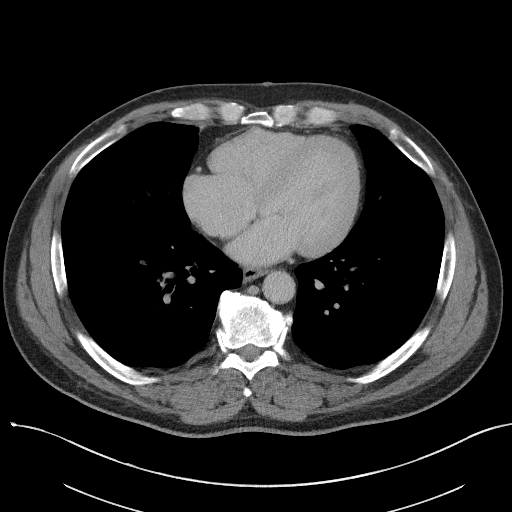

[Series 6: coronal st · coronal · 0.86mm/px · 3 of 112 slices shown]
[im 38/112  soft-tissue]
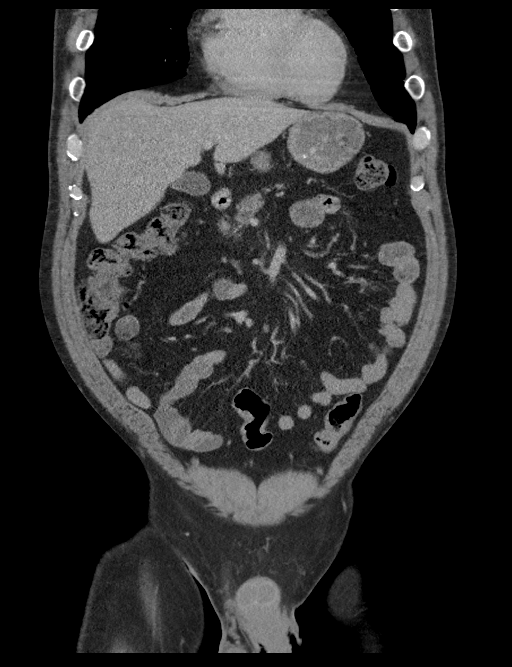
[im 50/112  soft-tissue]
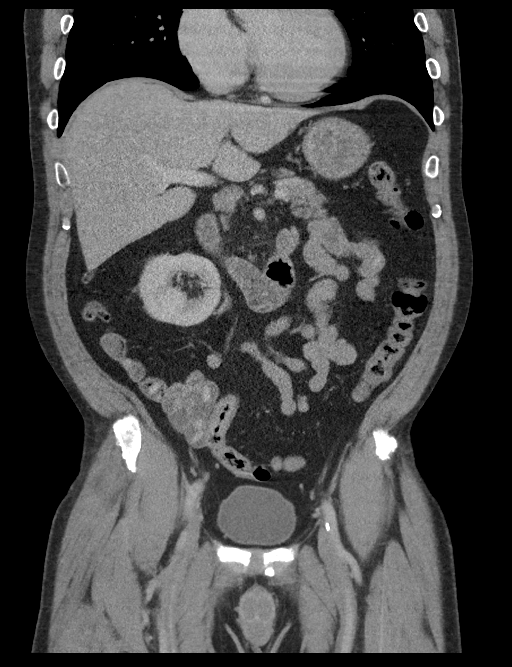
[im 62/112  soft-tissue]
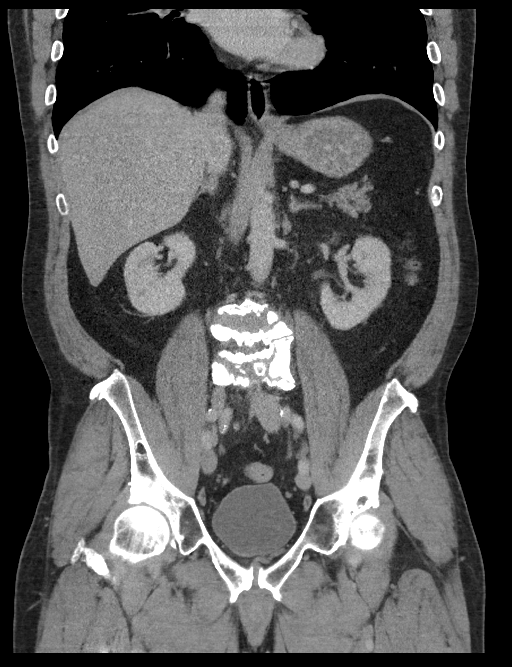

[16 of 46 positions shown; findings below may reference images not displayed]

FINDINGS: Lower chest: No pulmonary nodules. No visible pleural or pericardial
effusion.

Hepatobiliary: Normal hepatic size and contours without focal liver
lesion. No perihepatic ascites. No intra- or extrahepatic biliary
dilatation. Normal gallbladder.

Pancreas: Normal pancreatic contours and enhancement. No
peripancreatic fluid collection or pancreatic ductal dilatation.

Spleen: Normal.

Adrenals/Urinary Tract: Normal adrenal glands. No hydronephrosis or
solid renal mass.

Stomach/Bowel: There is hyperdense material within the right lower
quadrant ileum (series 2 images 67- 75). This is of similar
attenuation to the hyperdense foci within the stomach, which are
likely ingested medications. No abnormal bowel dilatation. No bowel
wall thickening or adjacent fat stranding to indicate acute
inflammation. No abdominal fluid collection. Normal appendix.

Vascular/Lymphatic: There is atherosclerotic calcification within
the aorta and its branch vessels. No abdominal or pelvic adenopathy.

Reproductive: Prostate and seminal vesicles are normal.

Musculoskeletal: Degenerative changes of both hips. Multilevel
thoracolumbar osteophytosis. No lytic or blastic lesions. No
visualize lower rib fracture.

Other: No contributory non-categorized findings.
IMPRESSION: 1. No acute findings identified to explain the reported left upper
quadrant pain.
2. Hyperdense material within the right lower quadrant ileum is
favored to be ingested material such is medication, given the
multiple hyperdensities seen in the stomach and jejunum, which are
of similar attenuation value. Blood products within the small bowel
lumen are considered less likely.
3. Aortic atherosclerosis.

## 2019-09-16 DIAGNOSIS — E669 Obesity, unspecified: Secondary | ICD-10-CM | POA: Insufficient documentation

## 2019-09-16 DIAGNOSIS — E1169 Type 2 diabetes mellitus with other specified complication: Secondary | ICD-10-CM | POA: Insufficient documentation

## 2019-09-16 DIAGNOSIS — I152 Hypertension secondary to endocrine disorders: Secondary | ICD-10-CM | POA: Insufficient documentation

## 2019-09-16 DIAGNOSIS — E1159 Type 2 diabetes mellitus with other circulatory complications: Secondary | ICD-10-CM | POA: Insufficient documentation

## 2020-02-12 ENCOUNTER — Emergency Department: Payer: Medicare Other

## 2020-02-12 ENCOUNTER — Emergency Department
Admission: EM | Admit: 2020-02-12 | Discharge: 2020-02-12 | Disposition: A | Discharge status: Home / Self Care | Payer: Medicare Other | Attending: Emergency Medicine | Admitting: Emergency Medicine

## 2020-02-12 ENCOUNTER — Other Ambulatory Visit: Payer: Self-pay

## 2020-02-12 DIAGNOSIS — S82831A Other fracture of upper and lower end of right fibula, initial encounter for closed fracture: Secondary | ICD-10-CM | POA: Insufficient documentation

## 2020-02-12 DIAGNOSIS — Z96641 Presence of right artificial hip joint: Secondary | ICD-10-CM | POA: Diagnosis not present

## 2020-02-12 DIAGNOSIS — S99911A Unspecified injury of right ankle, initial encounter: Secondary | ICD-10-CM | POA: Diagnosis present

## 2020-02-12 DIAGNOSIS — F17210 Nicotine dependence, cigarettes, uncomplicated: Secondary | ICD-10-CM | POA: Insufficient documentation

## 2020-02-12 DIAGNOSIS — Z91040 Latex allergy status: Secondary | ICD-10-CM | POA: Diagnosis not present

## 2020-02-12 MED ORDER — HYDROCODONE-ACETAMINOPHEN 5-325 MG PO TABS: 1.0000 | ORAL_TABLET | Freq: Four times a day (QID) | ORAL | 0 refills | Status: DC | PRN

## 2020-02-12 NOTE — ED Triage Notes (Signed)
Pt reports R ankle pain after falling while trying to get into van. States heard it pop three times. Denies any other pain, hitting head or LOC.

## 2020-02-12 NOTE — ED Provider Notes (Signed)
[facility] Emergency Department Provider Note  ____________________________________________   Event Date/Time   First MD Initiated Contact with Patient 02/12/20 2106     (approximate)  I have reviewed the triage vital signs and the nursing notes.   HISTORY  Chief Complaint Ankle Pain    HPI Charles Austin is a 65 y.o. male presents emergency department stating that he was trying to get in the [place] and fell injuring his ankle. He states he felt 3 pops. States the pain is located in the ankle not in his hip or knee. No head injury.    Past Medical History:  Diagnosis Date  . Osteoarthritis of right hip 2017   all over!!    Patient Active Problem List   Diagnosis Date Noted  . Heterotopic ossification of bone 10/30/2017  . Primary localized osteoarthritis of right hip 12/28/2015    Past Surgical History:  Procedure Laterality Date  . JOINT REPLACEMENT Right 2016   TOTAL HIP REPLACEMENT  . KNEE ARTHROSCOPY WITH MEDIAL MENISECTOMY Right 08/17/2016   Procedure: KNEE ARTHROSCOPY WITH partial MEDIAL MENISECTOMY, PARTIAL SYNOVECTOMY;  Surgeon: Kennedy Bucker, MD;  Location: [facility] ORS;  Service: Orthopedics;  Laterality: Right;  . TOTAL HIP ARTHROPLASTY Right 12/28/2015   Procedure: TOTAL HIP ARTHROPLASTY ANTERIOR APPROACH;  Surgeon: Kennedy Bucker, MD;  Location: [facility] ORS;  Service: Orthopedics;  Laterality: Right;  . TOTAL HIP ARTHROPLASTY Right 10/30/2017   Procedure: TOTAL HIP ARTHROPLASTY ANTERIOR APPROACH( THIS IS A REVISION OF HETERTOPIC OSSIFICATION);  Surgeon: Kennedy Bucker, MD;  Location: [facility] ORS;  Service: Orthopedics;  Laterality: Right;    Prior to Admission medications   Medication Sig Start Date End Date Taking? Authorizing Provider  HYDROcodone-acetaminophen (NORCO/VICODIN) 5-325 MG tablet Take 1 tablet by mouth every 6 (six) hours as needed for moderate pain. 02/12/20  Yes Elishua Radford, Roselyn Bering, PA-C  acetaminophen (TYLENOL) 500 MG tablet Take  1,000 mg by mouth at bedtime.    [provider]  docusate sodium (COLACE) 100 MG capsule Take 1 capsule (100 mg total) by mouth 2 (two) times daily. 11/01/17   Evon Slack, PA-C    Allergies Latex  Family History  Problem Relation Age of Onset  . Diabetes Mother   . Alzheimer's disease Mother   . Diabetes Father     Social History Social History   Tobacco Use  . Smoking status: Current Every Day Smoker    Packs/day: 0.25    Types: Cigarettes  . Smokeless tobacco: Never Used  Vaping Use  . Vaping Use: Never used  Substance Use Topics  . Alcohol use: Yes    Comment: occassional beer  . Drug use: No    Review of Systems  Constitutional: No fever/chills Eyes: No visual changes. ENT: No sore throat. Respiratory: Denies cough Genitourinary: Negative for dysuria. Musculoskeletal: Negative for back pain. Positive for right ankle pain Skin: Negative for rash. Psychiatric: no mood changes,     ____________________________________________   PHYSICAL EXAM:  VITAL SIGNS: ED Triage Vitals  Enc Vitals Group     BP 02/12/20 1907 123/60     Pulse Rate 02/12/20 1907 86     Resp 02/12/20 1907 18     Temp 02/12/20 1907 98.6 F (37 C)     Temp Source 02/12/20 1907 Oral     SpO2 02/12/20 1907 97 %     Weight 02/12/20 1903 260 lb (117.9 kg)     Height 02/12/20 1903 6\' 2"  (1.88 m)  Head Circumference --      Peak Flow --      Pain Score 02/12/20 1903 10     Pain Loc --      Pain Edu? --      Excl. in GC? --     Constitutional: Alert and oriented. Well appearing and in no acute distress. Eyes: Conjunctivae are normal.  Head: Atraumatic. Nose: No congestion/rhinnorhea. Mouth/Throat: Mucous membranes are moist.  Neck:  supple no lymphadenopathy noted Cardiovascular: Normal rate, regular rhythm.  Respiratory: Normal respiratory effort.  No retractions GU: deferred Musculoskeletal: FROM all extremities, warm and well perfused, right ankle is tender at  the lateral malleolus, foot is nontender, knee is nontender, neurovascular is intact Neurologic:  Normal speech and language.  Skin:  Skin is warm, dry and intact. No rash noted. Psychiatric: Mood and affect are normal. Speech and behavior are normal.  ____________________________________________   LABS (all labs ordered are listed, but only abnormal results are displayed)  Labs Reviewed - No data to display ____________________________________________   ____________________________________________  RADIOLOGY  X-ray of the right ankle  ____________________________________________   PROCEDURES  Procedure(s) performed: CAM Walker applied by nursing staff   Procedures    ____________________________________________   INITIAL IMPRESSION / ASSESSMENT AND PLAN / ED COURSE  Pertinent labs & imaging results that were available during my care of the patient were reviewed by me and considered in my medical decision making (see chart for details).   Patient 65 year old male presents with complaints of right ankle pain. See HPI. Physical exam shows the right ankle to be tender along the lateral malleolus.  X-ray is consistent with a fibula fracture. Was confirmed by radiology.  Patient was placed in a cam walker. He is to elevate and ice. Was given pain medication. He is to take over-the-counter Tylenol or ibuprofen and save the narcotic for at night. He is to follow-up with orthopedics in 2 weeks. He is discharged stable condition.     Charles Austin was evaluated in Emergency Department on 02/12/2020 for the symptoms described in the history of present illness. He was evaluated in the context of the global COVID-19 pandemic, which necessitated consideration that the patient might be at risk for infection with the SARS-CoV-2 virus that causes COVID-19. Institutional protocols and algorithms that pertain to the evaluation of patients at risk for COVID-19 are in a state of rapid  change based on information released by regulatory bodies including the CDC and federal and state organizations. These policies and algorithms were followed during the patient's care in the ED.    As part of my medical decision making, I reviewed the following data within the electronic MEDICAL RECORD NUMBER Nursing notes reviewed and incorporated, Old chart reviewed, Radiograph reviewed , Notes from prior ED visits and [place] Controlled Substance Database  ____________________________________________   FINAL CLINICAL IMPRESSION(S) / ED DIAGNOSES  Final diagnoses:  Closed fracture of distal end of right fibula, unspecified fracture morphology, initial encounter      NEW MEDICATIONS STARTED DURING THIS VISIT:  New Prescriptions   HYDROCODONE-ACETAMINOPHEN (NORCO/VICODIN) 5-325 MG TABLET    Take 1 tablet by mouth every 6 (six) hours as needed for moderate pain.     Note:  This document was prepared using Dragon voice recognition software and may include unintentional dictation errors.    Faythe Ghee, PA-C 02/12/20 2120    Minna Antis, MD 02/12/20 2239

## 2020-02-12 NOTE — Discharge Instructions (Signed)
Follow up with your regular doctor as needed, return to the ER if worsening Use the boot until seen by orthopedics, this is the same as a cast Take the pain medication as needed

## 2020-03-20 ENCOUNTER — Other Ambulatory Visit: Payer: Self-pay

## 2020-03-20 ENCOUNTER — Emergency Department: Payer: Medicare Other

## 2020-03-20 ENCOUNTER — Emergency Department
Admission: EM | Admit: 2020-03-20 | Discharge: 2020-03-20 | Disposition: A | Discharge status: Home / Self Care | Payer: Medicare Other | Attending: Emergency Medicine | Admitting: Emergency Medicine

## 2020-03-20 DIAGNOSIS — Z96641 Presence of right artificial hip joint: Secondary | ICD-10-CM | POA: Insufficient documentation

## 2020-03-20 DIAGNOSIS — W19XXXA Unspecified fall, initial encounter: Secondary | ICD-10-CM | POA: Insufficient documentation

## 2020-03-20 DIAGNOSIS — Z91040 Latex allergy status: Secondary | ICD-10-CM | POA: Diagnosis not present

## 2020-03-20 DIAGNOSIS — M25532 Pain in left wrist: Secondary | ICD-10-CM | POA: Diagnosis present

## 2020-03-20 DIAGNOSIS — F17210 Nicotine dependence, cigarettes, uncomplicated: Secondary | ICD-10-CM | POA: Insufficient documentation

## 2020-03-20 MED ORDER — NAPROXEN 500 MG PO TABS: 500.0000 mg | ORAL_TABLET | Freq: Two times a day (BID) | ORAL | Status: DC

## 2020-03-20 MED ORDER — TRAMADOL HCL 50 MG PO TABS
50.0000 mg | ORAL_TABLET | Freq: Once | ORAL | Status: AC
  Administered 2020-03-20: 50 mg via ORAL
  Filled 2020-03-20: qty 1

## 2020-03-20 MED ORDER — NAPROXEN 500 MG PO TABS
500.0000 mg | ORAL_TABLET | Freq: Once | ORAL | Status: AC
  Administered 2020-03-20: 500 mg via ORAL
  Filled 2020-03-20: qty 1

## 2020-03-20 NOTE — ED Triage Notes (Signed)
Pt from home, C/c. Left Wrist Pain, Pt stated had an accidental fall ( Yesterday ) , denies hitting head or passing out.

## 2020-03-20 NOTE — ED Notes (Signed)
Xray with pt 

## 2020-03-20 NOTE — ED Notes (Signed)
Pt states he had a mechanical fall due to right leg injury and injured his left wrist/forarm. Pt denies LOC. No obvious deformity or broken skin noted. Swelling is noted in left wrist. Ice pack applied to left wrist/forearm. Pt is ambulatory with obvious limp.

## 2020-03-20 NOTE — ED Provider Notes (Signed)
[facility] Emergency Department Provider Note   ____________________________________________   Event Date/Time   First MD Initiated Contact with Patient 03/20/20 1705     (approximate)  I have reviewed the triage vital signs and the nursing notes.   HISTORY  Chief Complaint Wrist Pain (Left wrist pain )    HPI Charles Austin is a 65 y.o. male patient presents with left wrist pain secondary to fall which occurred yesterday.  Patient denies LOC or head injury.  Denies loss of sensation.  Decreased range of motion with extension.  Point to the distal radius is complaining of pain.  His pain is a 3/10.  Described pain as "achy".  No palliative measure for complaint.         Past Medical History:  Diagnosis Date  . Osteoarthritis of right hip 2017   all over!!    Patient Active Problem List   Diagnosis Date Noted  . Heterotopic ossification of bone 10/30/2017  . Primary localized osteoarthritis of right hip 12/28/2015    Past Surgical History:  Procedure Laterality Date  . JOINT REPLACEMENT Right 2016   TOTAL HIP REPLACEMENT  . KNEE ARTHROSCOPY WITH MEDIAL MENISECTOMY Right 08/17/2016   Procedure: KNEE ARTHROSCOPY WITH partial MEDIAL MENISECTOMY, PARTIAL SYNOVECTOMY;  Surgeon: Kennedy Bucker, MD;  Location: [facility] ORS;  Service: Orthopedics;  Laterality: Right;  . TOTAL HIP ARTHROPLASTY Right 12/28/2015   Procedure: TOTAL HIP ARTHROPLASTY ANTERIOR APPROACH;  Surgeon: Kennedy Bucker, MD;  Location: [facility] ORS;  Service: Orthopedics;  Laterality: Right;  . TOTAL HIP ARTHROPLASTY Right 10/30/2017   Procedure: TOTAL HIP ARTHROPLASTY ANTERIOR APPROACH( THIS IS A REVISION OF HETERTOPIC OSSIFICATION);  Surgeon: Kennedy Bucker, MD;  Location: [facility] ORS;  Service: Orthopedics;  Laterality: Right;    Prior to Admission medications   Medication Sig Start Date End Date Taking? Authorizing Provider  naproxen (NAPROSYN) 500 MG tablet Take 1 tablet (500 mg total) by  mouth 2 (two) times daily with a meal. 03/20/20  Yes Joni Reining, PA-C  acetaminophen (TYLENOL) 500 MG tablet Take 1,000 mg by mouth at bedtime.    [provider]  docusate sodium (COLACE) 100 MG capsule Take 1 capsule (100 mg total) by mouth 2 (two) times daily. 11/01/17   Evon Slack, PA-C  HYDROcodone-acetaminophen (NORCO/VICODIN) 5-325 MG tablet Take 1 tablet by mouth every 6 (six) hours as needed for moderate pain. 02/12/20   Faythe Ghee, PA-C    Allergies Latex  Family History  Problem Relation Age of Onset  . Diabetes Mother   . Alzheimer's disease Mother   . Diabetes Father     Social History Social History   Tobacco Use  . Smoking status: Current Every Day Smoker    Packs/day: 0.25    Types: Cigarettes  . Smokeless tobacco: Never Used  Vaping Use  . Vaping Use: Never used  Substance Use Topics  . Alcohol use: Yes    Comment: occassional beer  . Drug use: No    Review of Systems Constitutional: No fever/chills Eyes: No visual changes. ENT: No sore throat. Cardiovascular: Denies chest pain. Respiratory: Denies shortness of breath. Gastrointestinal: No abdominal pain.  No nausea, no vomiting.  No diarrhea.  No constipation. Genitourinary: Negative for dysuria. Musculoskeletal: Left wrist pain.. Skin: Negative for rash. Neurological: Negative for headaches, focal weakness or numbness. Allergic/Immunilogical: Latex.  ____________________________________________   PHYSICAL EXAM:  VITAL SIGNS: ED Triage Vitals  Enc Vitals Group     BP 03/20/20 1653 Marland Kitchen)  163/80     Pulse Rate 03/20/20 1650 81     Resp 03/20/20 1650 18     Temp 03/20/20 1650 99.4 F (37.4 C)     Temp Source 03/20/20 1650 Oral     SpO2 03/20/20 1650 100 %     Weight 03/20/20 1652 240 lb (108.9 kg)     Height 03/20/20 1652 6\' 3"  (1.905 m)     Head Circumference --      Peak Flow --      Pain Score 03/20/20 1651 3     Pain Loc --      Pain Edu? --      Excl. in GC? --      Constitutional: Alert and oriented. Well appearing and in no acute distress. Cardiovascular: Normal rate, regular rhythm. Grossly normal heart sounds.  Good peripheral circulation.  Elevated blood pressure. Respiratory: Normal respiratory effort.  No retractions. Lungs CTAB. Musculoskeletal: No obvious deformity to the left wrist.  Patient has decreased range of motion of extension.  Mild guarding palpation of distal radius. Neurologic:  Normal speech and language. No gross focal neurologic deficits are appreciated. No gait instability. Skin:  Skin is warm, dry and intact. No rash noted.  No abrasion or ecchymosis. Psychiatric: Mood and affect are normal. Speech and behavior are normal.  ____________________________________________   LABS (all labs ordered are listed, but only abnormal results are displayed)  Labs Reviewed - No data to display ____________________________________________  EKG   ____________________________________________  RADIOLOGY I, 03/22/20, personally viewed and evaluated these images (plain radiographs) as part of my medical decision making, as well as reviewing the written report by the radiologist.  ED MD interpretation: No acute findings x-ray of the left wrist.  Official radiology report(s): DG Wrist Complete Left  Result Date: 03/20/2020 CLINICAL DATA:  Left wrist pain after a fall.  Initial encounter. EXAM: LEFT WRIST - COMPLETE 3+ VIEW COMPARISON:  None. FINDINGS: There is no acute bony or joint abnormality. Joint spaces are preserved. No soft tissue gas or radiopaque foreign body. IMPRESSION: Negative exam. Electronically Signed   By: 03/22/2020 M.D.   On: 03/20/2020 17:16    ____________________________________________   PROCEDURES  Procedure(s) performed (including Critical Care):  Procedures   ____________________________________________   INITIAL IMPRESSION / ASSESSMENT AND PLAN / ED COURSE  As part of my medical  decision making, I reviewed the following data within the electronic MEDICAL RECORD NUMBER         Patient presents with left wrist pain secondary to mechanical fall yesterday.  Discussed no acute findings x-ray of the left wrist.  Patient placed in a splint and given discharge care instruction.  Patient complaint and physical exam consistent with wrist pain secondary to fall.  Patient placed in a wrist splint and given discharge care instructions.  Take anti-inflammatory medication as directed..      ____________________________________________   FINAL CLINICAL IMPRESSION(S) / ED DIAGNOSES  Final diagnoses:  Acute pain of left wrist     ED Discharge Orders         Ordered    naproxen (NAPROSYN) 500 MG tablet  2 times daily with meals        03/20/20 1725          *Please note:  03/22/20 was evaluated in Emergency Department on 03/20/2020 for the symptoms described in the history of present illness. He was evaluated in the context of the global COVID-19 pandemic, which necessitated consideration  that the patient might be at risk for infection with the SARS-CoV-2 virus that causes COVID-19. Institutional protocols and algorithms that pertain to the evaluation of patients at risk for COVID-19 are in a state of rapid change based on information released by regulatory bodies including the CDC and federal and state organizations. These policies and algorithms were followed during the patient's care in the ED.  Some ED evaluations and interventions may be delayed as a result of limited staffing during and the pandemic.*   Note:  This document was prepared using Dragon voice recognition software and may include unintentional dictation errors.    Joni Reining, PA-C 03/20/20 1734    Sharman Cheek, MD 03/21/20 [phone]

## 2020-03-20 NOTE — Discharge Instructions (Addendum)
No fracture found on x-ray of the left wrist.  Wear wrist splint for 3 to 5 days as needed.  Follow discharge care instruction take medication as directed.

## 2020-05-08 ENCOUNTER — Encounter: Payer: Self-pay | Admitting: Emergency Medicine

## 2020-05-08 ENCOUNTER — Other Ambulatory Visit: Payer: Self-pay

## 2020-05-08 ENCOUNTER — Emergency Department: Payer: Medicare Other

## 2020-05-08 ENCOUNTER — Emergency Department
Admission: EM | Admit: 2020-05-08 | Discharge: 2020-05-08 | Disposition: A | Discharge status: Home / Self Care | Payer: Medicare Other | Attending: Emergency Medicine | Admitting: Emergency Medicine

## 2020-05-08 DIAGNOSIS — Z96641 Presence of right artificial hip joint: Secondary | ICD-10-CM | POA: Insufficient documentation

## 2020-05-08 DIAGNOSIS — F17210 Nicotine dependence, cigarettes, uncomplicated: Secondary | ICD-10-CM | POA: Insufficient documentation

## 2020-05-08 DIAGNOSIS — R059 Cough, unspecified: Secondary | ICD-10-CM | POA: Diagnosis present

## 2020-05-08 DIAGNOSIS — J069 Acute upper respiratory infection, unspecified: Secondary | ICD-10-CM | POA: Diagnosis not present

## 2020-05-08 MED ORDER — PSEUDOEPH-BROMPHEN-DM 30-2-10 MG/5ML PO SYRP: 5.0000 mL | ORAL_SOLUTION | Freq: Four times a day (QID) | ORAL | 0 refills | Status: AC | PRN

## 2020-05-08 NOTE — ED Notes (Signed)
Pt taken to xray 

## 2020-05-08 NOTE — ED Provider Notes (Signed)
[facility] Emergency Department Provider Note  ____________________________________________   Event Date/Time   First MD Initiated Contact with Patient 05/08/20 0831     (approximate)  I have reviewed the triage vital signs and the nursing notes.   HISTORY  Chief Complaint Cough, Nasal Congestion, and Chills   HPI Charles Austin is a 65 y.o. male presents with complaints of coughing for the last 6 days and now experiencing rib soreness on the left with cough.  Patient has had some sneezing.  No over-the-counter medication has been taken.  Patient denies any nasal congestion,  fever, chills, nausea, vomiting or loss of taste or smell.  He reports that he did have both shots of the Covid vaccine but is unaware of which one he received.  He states that the cough keeps him up at night.  Rates his discomfort as 5 out of 10.       Past Medical History:  Diagnosis Date  . Osteoarthritis of right hip 2017   all over!!    Patient Active Problem List   Diagnosis Date Noted  . Heterotopic ossification of bone 10/30/2017  . Primary localized osteoarthritis of right hip 12/28/2015    Past Surgical History:  Procedure Laterality Date  . JOINT REPLACEMENT Right 2016   TOTAL HIP REPLACEMENT  . KNEE ARTHROSCOPY WITH MEDIAL MENISECTOMY Right 08/17/2016   Procedure: KNEE ARTHROSCOPY WITH partial MEDIAL MENISECTOMY, PARTIAL SYNOVECTOMY;  Surgeon: Kennedy Bucker, MD;  Location: [facility] ORS;  Service: Orthopedics;  Laterality: Right;  . TOTAL HIP ARTHROPLASTY Right 12/28/2015   Procedure: TOTAL HIP ARTHROPLASTY ANTERIOR APPROACH;  Surgeon: Kennedy Bucker, MD;  Location: [facility] ORS;  Service: Orthopedics;  Laterality: Right;  . TOTAL HIP ARTHROPLASTY Right 10/30/2017   Procedure: TOTAL HIP ARTHROPLASTY ANTERIOR APPROACH( THIS IS A REVISION OF HETERTOPIC OSSIFICATION);  Surgeon: Kennedy Bucker, MD;  Location: [facility] ORS;  Service: Orthopedics;  Laterality: Right;    Prior to  Admission medications   Medication Sig Start Date End Date Taking? Authorizing Provider  brompheniramine-pseudoephedrine-DM 30-2-10 MG/5ML syrup Take 5 mLs by mouth 4 (four) times daily as needed. 05/08/20  Yes Tommi Rumps, PA-C  acetaminophen (TYLENOL) 500 MG tablet Take 1,000 mg by mouth at bedtime.    [provider]  docusate sodium (COLACE) 100 MG capsule Take 1 capsule (100 mg total) by mouth 2 (two) times daily. 11/01/17   Evon Slack, PA-C    Allergies Latex  Family History  Problem Relation Age of Onset  . Diabetes Mother   . Alzheimer's disease Mother   . Diabetes Father     Social History Social History   Tobacco Use  . Smoking status: Current Every Day Smoker    Packs/day: 0.25    Types: Cigarettes  . Smokeless tobacco: Never Used  Vaping Use  . Vaping Use: Never used  Substance Use Topics  . Alcohol use: Yes    Comment: occassional beer  . Drug use: No    Review of Systems Constitutional: No fever/chills Eyes: No visual changes.   ENT: No sore throat.  Negative for ear pain. Cardiovascular: Denies chest pain. Respiratory: Denies shortness of breath.  Also for cough. Gastrointestinal: No abdominal pain.  No nausea, no vomiting.  No diarrhea.  Genitourinary: Negative for dysuria. Musculoskeletal: Positive for left rib pain. Skin: Negative for rash. Neurological: Negative for headaches, focal weakness or numbness.   ____________________________________________   PHYSICAL EXAM:  VITAL SIGNS: ED Triage Vitals  Enc Vitals Group  BP 05/08/20 0831 135/78     Pulse Rate 05/08/20 0831 71     Resp 05/08/20 0831 18     Temp 05/08/20 0831 98.4 F (36.9 C)     Temp Source 05/08/20 0831 Oral     SpO2 05/08/20 0831 96 %     Weight 05/08/20 0826 240 lb (108.9 kg)     Height 05/08/20 0826 6\' 2"  (1.88 m)     Head Circumference --      Peak Flow --      Pain Score 05/08/20 0826 5     Pain Loc --      Pain Edu? --      Excl. in [place]? --      Constitutional: Alert and oriented. Well appearing and in no acute distress. Eyes: Conjunctivae are normal.  Head: Atraumatic. Nose: No congestion/rhinnorhea. Mouth/Throat: Mucous membranes are moist.  Oropharynx non-erythematous.  No exudate and uvula is midline. Neck: No stridor.   Cardiovascular: Normal rate, regular rhythm. Grossly normal heart sounds.  Good peripheral circulation. Respiratory: Normal respiratory effort.  No retractions. Lungs CTAB.  Tender left lateral ribs to palpation but no skin abrasions or discoloration.  No crepitus in the area. Gastrointestinal: Soft and nontender. No distention.  Musculoskeletal: Moves upper and lower extremities they have difficulty.  Normal gait was noted.  Patient is ambulatory without any assistance. Neurologic:  Normal speech and language. No gross focal neurologic deficits are appreciated. No gait instability. Skin:  Skin is warm, dry and intact. No rash noted. Psychiatric: Mood and affect are normal. Speech and behavior are normal.  ____________________________________________   LABS (all labs ordered are listed, but only abnormal results are displayed)  Labs Reviewed - No data to display ____________________________________________   RADIOLOGY I, 07/08/20, personally viewed and evaluated these images (plain radiographs) as part of my medical decision making, as well as reviewing the written report by the radiologist.   Official radiology report(s): DG Chest 2 View  Result Date: 05/08/2020 CLINICAL DATA:  One-week history of cough, chest congestion and sneezing. LEFT rib pain when coughing. Current smoker. EXAM: CHEST - 2 VIEW COMPARISON:  09/28/2015. FINDINGS: Cardiomediastinal silhouette unremarkable and unchanged. Lungs clear. Bronchovascular markings normal. Pulmonary vascularity normal. No visible pleural effusions. No pneumothorax. 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  IMPRESSION: No acute cardiopulmonary disease. Electronically Signed   By: 09/30/2015 M.D.   On: 05/08/2020 09:46    ____________________________________________   PROCEDURES  Procedure(s) performed (including Critical Care):  Procedures   ____________________________________________   INITIAL IMPRESSION / ASSESSMENT AND PLAN / ED COURSE  As part of my medical decision making, I reviewed the following data within the electronic MEDICAL RECORD NUMBER Notes from prior ED visits and [place] Controlled Substance Database  65 year old male presents to the ED with complaint of cough for 6 days.  Patient has not taken any over-the-counter medication for his symptoms.  He denies any fever, chills, change in taste or smell or other symptoms associated with Covid.  Patient is  immunized.  He does continue to smoke 2 to 3 cigarettes/day.  Chest x-ray was unremarkable.  O2 sat was 96% and patient is able to speak in complete sentences without any difficulties.  A prescription for Bromfed-DM was sent to his pharmacy.  He is encouraged to increase fluids and take Tylenol or ibuprofen as needed for rib pain.  He will follow-up with [facility] acute care if any continued problems or his  PCP.  He will return to the emergency department if any severe worsening of his symptoms such as difficulty breathing or shortness of breath.  ____________________________________________   FINAL CLINICAL IMPRESSION(S) / ED DIAGNOSES  Final diagnoses:  Upper respiratory tract infection, unspecified type     ED Discharge Orders         Ordered    brompheniramine-pseudoephedrine-DM 30-2-10 MG/5ML syrup  4 times daily PRN        05/08/20 0958          *Please note:  [name] was evaluated in Emergency Department on 05/08/2020 for the symptoms described in the history of present illness. He was evaluated in the context of the global COVID-19 pandemic, which necessitated consideration that the patient might  be at risk for infection with the SARS-CoV-2 virus that causes COVID-19. Institutional protocols and algorithms that pertain to the evaluation of patients at risk for COVID-19 are in a state of rapid change based on information released by regulatory bodies including the CDC and federal and state organizations. These policies and algorithms were followed during the patient's care in the ED.  Some ED evaluations and interventions may be delayed as a result of limited staffing during and the pandemic.*   Note:  This document was prepared using Dragon voice recognition software and may include unintentional dictation errors.    Tommi Rumps, PA-C 05/08/20 1006    Delton Prairie, MD 05/08/20 [phone]

## 2020-05-08 NOTE — ED Triage Notes (Signed)
Pt reports coughing, sneezing and congested for a few days and can't sleep at night due to coughing. Pt reports left rib pain when he coughs

## 2020-05-08 NOTE — Discharge Instructions (Signed)
A prescription for Bromfed-DM was sent to your pharmacy.  Begin taking this for cough and congestion.  Increase fluids.  You may also take Tylenol or ibuprofen as needed for your rib pain.  This should be every 6 hours as needed for pain.  Bromfed can also be taken 4 times a day as needed for cough and schedule the last dose prior to going to bed.  Decrease or discontinue smoking as this will help with your cough.  Follow-up with your primary care provider or [facility] acute care if any continued problems.

## 2020-05-08 NOTE — ED Notes (Signed)
Pt states coughing with rib soreness for about a week. States some sputum with cough. Denies nasal congestion.

## 2021-09-20 ENCOUNTER — Ambulatory Visit: Payer: Medicare Other | Admitting: Internal Medicine

## 2021-10-31 ENCOUNTER — Encounter: Payer: Self-pay | Admitting: Emergency Medicine

## 2021-10-31 ENCOUNTER — Emergency Department
Admission: EM | Admit: 2021-10-31 | Discharge: 2021-10-31 | Disposition: A | Discharge status: Home / Self Care | Payer: Medicare Other | Attending: Emergency Medicine | Admitting: Emergency Medicine

## 2021-10-31 ENCOUNTER — Other Ambulatory Visit: Payer: Self-pay

## 2021-10-31 ENCOUNTER — Emergency Department: Payer: Medicare Other

## 2021-10-31 DIAGNOSIS — S79911A Unspecified injury of right hip, initial encounter: Secondary | ICD-10-CM | POA: Diagnosis not present

## 2021-10-31 DIAGNOSIS — Z743 Need for continuous supervision: Secondary | ICD-10-CM | POA: Diagnosis not present

## 2021-10-31 DIAGNOSIS — Y9389 Activity, other specified: Secondary | ICD-10-CM | POA: Diagnosis not present

## 2021-10-31 DIAGNOSIS — M47816 Spondylosis without myelopathy or radiculopathy, lumbar region: Secondary | ICD-10-CM | POA: Diagnosis not present

## 2021-10-31 DIAGNOSIS — R6889 Other general symptoms and signs: Secondary | ICD-10-CM | POA: Diagnosis not present

## 2021-10-31 DIAGNOSIS — M7071 Other bursitis of hip, right hip: Secondary | ICD-10-CM | POA: Diagnosis not present

## 2021-10-31 DIAGNOSIS — M7061 Trochanteric bursitis, right hip: Secondary | ICD-10-CM

## 2021-10-31 DIAGNOSIS — M25551 Pain in right hip: Secondary | ICD-10-CM | POA: Diagnosis present

## 2021-10-31 DIAGNOSIS — R5381 Other malaise: Secondary | ICD-10-CM | POA: Diagnosis not present

## 2021-10-31 MED ORDER — KETOROLAC TROMETHAMINE 30 MG/ML IJ SOLN
30.0000 mg | Freq: Once | INTRAMUSCULAR | Status: AC
  Administered 2021-10-31: 30 mg via INTRAMUSCULAR
  Filled 2021-10-31: qty 1

## 2021-10-31 MED ORDER — MELOXICAM 15 MG PO TABS: 15.0000 mg | ORAL_TABLET | Freq: Every day | ORAL | 0 refills | Status: AC

## 2021-10-31 NOTE — ED Provider Notes (Signed)
[facility] Provider Note  Patient Contact: 8:08 PM (approximate)   History   Hip Pain   HPI  Charles Austin is a 66 y.o. male who presents the emergency department complaining of right hip pain.  Patient states that he had surgery to his right hip when he was roughly 79 with in-place hardware.  He has had no trauma to the area.  States that it hurts along the lateral hip.  The only thing that he can think of is that he slipped a few days ago getting out of shower.  Patient is still ambulatory but doing so increases the pain.  No radicular symptoms.  No bowel or bladder dysfunction, no back pain.     Physical Exam   Triage Vital Signs: ED Triage Vitals  Enc Vitals Group     BP 10/31/21 1700 101/67     Pulse Rate 10/31/21 1700 93     Resp 10/31/21 1700 18     Temp 10/31/21 1700 98.3 F (36.8 C)     Temp src --      SpO2 10/31/21 1700 97 %     Weight 10/31/21 1625 240 lb 1.3 oz (108.9 kg)     Height 10/31/21 1625 6\' 2"  (1.88 m)     Head Circumference --      Peak Flow --      Pain Score 10/31/21 1625 7     Pain Loc --      Pain Edu? --      Excl. in [place]? --     Most recent vital signs: Vitals:   10/31/21 1700  BP: 101/67  Pulse: 93  Resp: 18  Temp: 98.3 F (36.8 C)  SpO2: 97%     General: Alert and in no acute distress.  Cardiovascular:  Good peripheral perfusion Respiratory: Normal respiratory effort without tachypnea or retractions. Lungs CTAB.  Musculoskeletal: Full range of motion to all extremities.  No visible signs of trauma to the hip.  No edema, ecchymosis.  Tender along the lateral hip over the greater trochanter and proximal femur region.  No palpable abnormality.  No other tenderness.  Pulses sensation intact distally. Neurologic:  No gross focal neurologic deficits are appreciated.  Skin:   No rash noted Other:   ED Results / Procedures / Treatments   Labs (all labs ordered are listed, but only abnormal results are  displayed) Labs Reviewed - No data to display   EKG     RADIOLOGY  I personally viewed, evaluated, and interpreted these images as part of my medical decision making, as well as reviewing the written report by the radiologist.  ED Provider Interpretation: In-place hardware with no evidence of fracture or dislocation  DG Hip Unilat W or Wo Pelvis 2-3 Views Right  Result Date: 10/31/2021 CLINICAL DATA:  Hip pain for several days.  Trauma. EXAM: DG HIP (WITH OR WITHOUT PELVIS) 2-3V RIGHT COMPARISON:  None Available. FINDINGS: The patient is status post right hip replacement. Acetabular and femoral components are in good position. Chronic periosteal reaction along the medial aspect of the femur. No acute fracture. No dislocation. Degenerative changes in the left hip and lower lumbar spine. IMPRESSION: No acute fracture or dislocation of the right hip. Right hip hardware remains in good position. Electronically Signed   By: Dorise Bullion III M.D.   On: 10/31/2021 17:28    PROCEDURES:  Critical Care performed: No  Procedures   MEDICATIONS ORDERED IN ED: Medications  ketorolac (TORADOL) 30 MG/ML injection 30 mg (has no administration in time range)     IMPRESSION / MDM / ASSESSMENT AND PLAN / ED COURSE  I reviewed the triage vital signs and the nursing notes.                              Differential diagnosis includes, but is not limited to, hip fracture, dislocation, loosening of hardware.  IT band syndrome  Patient's presentation is most consistent with acute presentation with potential threat to life or bodily function.   Patient's diagnosis is consistent with bursitis.  Patient presents emergency department right hip pain.  Patient states that he slipped getting out of the shower but did not fall a few days ago.  Has had pain in his hip for 2 days.  No other symptoms.  X-ray reveals no acute traumatic finding and no loosening of the hardware.  Patiently placed on  anti-inflammatory for symptom relief.  Follow-up with orthopedics as needed..  Patient is given ED precautions to return to the ED for any worsening or new symptoms.        FINAL CLINICAL IMPRESSION(S) / ED DIAGNOSES   Final diagnoses:  Trochanteric bursitis of right hip     Rx / DC Orders   ED Discharge Orders          Ordered    meloxicam (MOBIC) 15 MG tablet  Daily        10/31/21 2015             Note:  This document was prepared using Dragon voice recognition software and may include unintentional dictation errors.   Lanette Hampshire 10/31/21 2016    Concha Se, MD 10/31/21 [phone]

## 2021-10-31 NOTE — ED Provider Triage Note (Signed)
Emergency Medicine Provider Triage Evaluation Note  Gari Crown , a 66 y.o. male  was evaluated in triage.  Pt complains of right hip pain.  History of surgery on the right hip.  Patient states aches like a tooth ache..  Review of Systems  Positive: Right hip pain Negative: Injury  Physical Exam  BP 101/67   Pulse 93   Temp 98.3 F (36.8 C)   Resp 18   Ht 6\' 2"  (1.88 m)   Wt 108.9 kg   SpO2 97%   BMI 30.82 kg/m  Gen:   Awake, no distress   Resp:  Normal effort  MSK:   Moves extremities without difficulty  Other:    Medical Decision Making  Medically screening exam initiated at 5:02 PM.  Appropriate orders placed.  Jaykub Evalina Field was informed that the remainder of the evaluation will be completed by another provider, this initial triage assessment does not replace that evaluation, and the importance of remaining in the ED until their evaluation is complete.     Versie Starks, PA-C 10/31/21 1702

## 2021-10-31 NOTE — ED Triage Notes (Signed)
First nurse Note:  Arrives from home via [facility] C/O right hip pain since  Friday.  A traumatic.  Hip surgery to right hip in the 1990's.   VS wnl.  Patient was ambulatory at home.  Gait more of a shuffle per EMS

## 2022-12-19 ENCOUNTER — Ambulatory Visit: Payer: 59 | Admitting: Nurse Practitioner

## 2023-05-03 ENCOUNTER — Encounter: Payer: Self-pay | Admitting: Emergency Medicine

## 2023-05-03 ENCOUNTER — Other Ambulatory Visit: Payer: Self-pay

## 2023-05-03 ENCOUNTER — Emergency Department
Admission: EM | Admit: 2023-05-03 | Discharge: 2023-05-03 | Disposition: A | Discharge status: Home / Self Care | Attending: Emergency Medicine | Admitting: Emergency Medicine

## 2023-05-03 ENCOUNTER — Emergency Department

## 2023-05-03 DIAGNOSIS — S0181XA Laceration without foreign body of other part of head, initial encounter: Secondary | ICD-10-CM | POA: Diagnosis not present

## 2023-05-03 DIAGNOSIS — W19XXXA Unspecified fall, initial encounter: Secondary | ICD-10-CM | POA: Insufficient documentation

## 2023-05-03 DIAGNOSIS — S0990XA Unspecified injury of head, initial encounter: Secondary | ICD-10-CM | POA: Diagnosis present

## 2023-05-03 DIAGNOSIS — R42 Dizziness and giddiness: Secondary | ICD-10-CM

## 2023-05-03 DIAGNOSIS — Z96641 Presence of right artificial hip joint: Secondary | ICD-10-CM | POA: Diagnosis not present

## 2023-05-03 DIAGNOSIS — Z23 Encounter for immunization: Secondary | ICD-10-CM | POA: Insufficient documentation

## 2023-05-03 LAB — COMPREHENSIVE METABOLIC PANEL WITH GFR
ALT: 19 U/L (ref 0–44)
AST: 23 U/L (ref 15–41)
Albumin: 4 g/dL (ref 3.5–5.0)
Alkaline Phosphatase: 65 U/L (ref 38–126)
Anion gap: 15 (ref 5–15)
BUN: 11 mg/dL (ref 8–23)
CO2: 21 mmol/L — ABNORMAL LOW (ref 22–32)
Calcium: 8.8 mg/dL — ABNORMAL LOW (ref 8.9–10.3)
Chloride: 105 mmol/L (ref 98–111)
Creatinine, Ser: 0.79 mg/dL (ref 0.61–1.24)
GFR, Estimated: >60 mL/min (ref >60)
Glucose, Bld: 95 mg/dL (ref 70–99)
Potassium: 4.7 mmol/L (ref 3.5–5.1)
Sodium: 141 mmol/L (ref 135–145)
Total Bilirubin: 0.6 mg/dL (ref 0.0–1.2)
Total Protein: 8 g/dL (ref 6.5–8.1)

## 2023-05-03 LAB — CBC WITH DIFFERENTIAL/PLATELET
Abs Immature Granulocytes: 0.02 10*3/uL (ref 0.00–0.07)
Basophils Absolute: 0.1 10*3/uL (ref 0.0–0.1)
Basophils Relative: 1 %
Eosinophils Absolute: 0.1 10*3/uL (ref 0.0–0.5)
Eosinophils Relative: 2 %
HCT: 43.8 % (ref 39.0–52.0)
Hemoglobin: 14.4 g/dL (ref 13.0–17.0)
Immature Granulocytes: 0 %
Lymphocytes Relative: 27 %
Lymphs Abs: 2.1 10*3/uL (ref 0.7–4.0)
MCH: 25.7 pg — ABNORMAL LOW (ref 26.0–34.0)
MCHC: 32.9 g/dL (ref 30.0–36.0)
MCV: 78.1 fL — ABNORMAL LOW (ref 80.0–100.0)
Monocytes Absolute: 0.7 10*3/uL (ref 0.1–1.0)
Monocytes Relative: 9 %
Neutro Abs: 4.7 10*3/uL (ref 1.7–7.7)
Neutrophils Relative %: 61 %
Platelets: 285 10*3/uL (ref 150–400)
RBC: 5.61 MIL/uL (ref 4.22–5.81)
RDW: 15 % (ref 11.5–15.5)
WBC: 7.7 10*3/uL (ref 4.0–10.5)
nRBC: 0 % (ref 0.0–0.2)

## 2023-05-03 LAB — TROPONIN I (HIGH SENSITIVITY)
Troponin I (High Sensitivity): 20 ng/L — ABNORMAL HIGH (ref <18)
Troponin I (High Sensitivity): 23 ng/L — ABNORMAL HIGH (ref <18)

## 2023-05-03 MED ORDER — ACETAMINOPHEN 500 MG PO TABS
1000.0000 mg | ORAL_TABLET | Freq: Once | ORAL | Status: AC
  Administered 2023-05-03: 1000 mg via ORAL
  Filled 2023-05-03: qty 2

## 2023-05-03 MED ORDER — TETANUS-DIPHTH-ACELL PERTUSSIS 5-2.5-18.5 LF-MCG/0.5 IM SUSY
0.5000 mL | PREFILLED_SYRINGE | Freq: Once | INTRAMUSCULAR | Status: AC
  Administered 2023-05-03: 0.5 mL via INTRAMUSCULAR
  Filled 2023-05-03: qty 0.5

## 2023-05-03 MED ORDER — LIDOCAINE HCL (PF) 1 % IJ SOLN
5.0000 mL | Freq: Once | INTRAMUSCULAR | Status: AC
  Administered 2023-05-03: 5 mL
  Filled 2023-05-03: qty 5

## 2023-05-03 NOTE — ED Triage Notes (Signed)
 First Nurse Note;  Pt via [facility] from home. Pt states sometime during the night pt had a fall. Denies LOC, mechanical fall going to the bathroom. Denies any blood thinners. 1.5 inch above R eye. Pt is A&Ox4 and NAD 174/86 BP  80 HR 100% on RA

## 2023-05-03 NOTE — ED Triage Notes (Addendum)
 See first nurse note. Reports fall in middle of night. Denies lightheadedness or dizziness at time of fall. Head wrapped and bleeding controlled in triage.   Reports having beers yesterday - states only two beers.

## 2023-05-03 NOTE — ED Notes (Signed)
 See triage notes. Patient c/o bump to the head and questionable LOC secondary to a fall last night. Bleeding controlled at this time.

## 2023-05-03 NOTE — ED Provider Triage Note (Signed)
 Emergency Medicine Provider Triage Evaluation Note  [name] , a 68 y.o. male  was evaluated in triage.  Pt complains of fall last night while drinking.  Did not know if hit his head till this morning.  Has laceration above the right eye.  Review of Systems  Positive:  Negative:   Physical Exam  BP (!) 164/110 (BP Location: Left Arm)   Pulse 95   Temp 98 F (36.7 C) (Oral)   Resp 18   SpO2 98%  Gen:   Awake, no distress   Resp:  Normal effort  MSK:   Moves extremities without difficulty  Other:    Medical Decision Making  Medically screening exam initiated at 11:59 AM.  Appropriate orders placed.  Carlson Karsten Ro was informed that the remainder of the evaluation will be completed by another provider, this initial triage assessment does not replace that evaluation, and the importance of remaining in the ED until their evaluation is complete.     Faythe Ghee, PA-C 05/03/23 1200

## 2023-05-03 NOTE — ED Provider Notes (Signed)
 [facility] Provider Note    Event Date/Time   First MD Initiated Contact with Patient 05/03/23 [phone]     (approximate)   History   Fall   HPI  Charles Austin is a 68 y.o. male with history of hip osteoarthritis status post right total hip replacement, presenting with headache after fall.  Patient states that he had 3 beers last night.  States that he felt lightheaded, fell while going to the bathroom.  No LOC, not on any blood thinners.  Has a 2 cm laceration to above the right eyebrow.  Unknown Tdap status.  He denies any symptoms right now, no pain anywhere else.  States that he did not notice that he cut himself until this morning.  On independent chart review, he was seen by Ortho in 2020, had his total hip revision surgery done in 2019.  Patient states that he has chronic hip pain, no new pain at this time.  No weakness or numbness.     Physical Exam   Triage Vital Signs: ED Triage Vitals  Encounter Vitals Group     BP 05/03/23 1155 (!) 164/110     Systolic BP Percentile --      Diastolic BP Percentile --      Pulse Rate 05/03/23 1155 95     Resp 05/03/23 1155 18     Temp 05/03/23 1155 98 F (36.7 C)     Temp Source 05/03/23 1155 Oral     SpO2 05/03/23 1155 98 %     Weight --      Height --      Head Circumference --      Peak Flow --      Pain Score 05/03/23 1156 4     Pain Loc --      Pain Education --      Exclude from Growth Chart --     Most recent vital signs: Vitals:   05/03/23 1200 05/03/23 1629  BP: (!) 176/76 (!) 154/76  Pulse:  96  Resp:  18  Temp:  98.1 F (36.7 C)  SpO2:  98%     General: Awake, no distress.  CV:  Good peripheral perfusion.  Resp:  Normal effort.  Clear, no thoracic cage tenderness Abd:  No distention.  Nontender Other:  2 cm laceration above the right eyebrow, pupils are equal and reactive, extraocular movements are intact, no other palpable scalp deformities or tenderness, no midline spinal  tenderness, TMs are clear bilaterally, he has 1/2 cm T shaped upper right inner lip plaque.  No tenderness to his teeth and gingiva, no facial tenderness.  Equal radial and DP pulses bilaterally.  No focal weakness or numbness, no bony tenderness to his extremities.   ED Results / Procedures / Treatments   Labs (all labs ordered are listed, but only abnormal results are displayed) Labs Reviewed  COMPREHENSIVE METABOLIC PANEL WITH GFR - Abnormal; Notable for the following components:      Result Value   CO2 21 (*)    Calcium 8.8 (*)    All other components within normal limits  CBC WITH DIFFERENTIAL/PLATELET - Abnormal; Notable for the following components:   MCV 78.1 (*)    MCH 25.7 (*)    All other components within normal limits  TROPONIN I (HIGH SENSITIVITY) - Abnormal; Notable for the following components:   Troponin I (High Sensitivity) 20 (*)    All other components within normal limits  TROPONIN  I (HIGH SENSITIVITY) - Abnormal; Notable for the following components:   Troponin I (High Sensitivity) 23 (*)    All other components within normal limits     EKG  EKG showed normal sinus rhythm, rate of 79, normal QRS, normal QTc, T wave inversion to 3, T wave flattening to aVF, no ischemic ST elevation, not significant change compared to prior   RADIOLOGY CT head on my independent interpretation without obvious intracranial hemorrhage.   PROCEDURES:  Critical Care performed: No  .Laceration Repair  Date/Time: 05/03/2023 4:06 PM  Performed by: Claybon Jabs, MD Authorized by: Claybon Jabs, MD   Consent:    Consent obtained:  Verbal   Consent given by:  Patient   Risks discussed:  Infection and pain Anesthesia:    Anesthesia method:  Local infiltration   Local anesthetic:  Lidocaine 1% w/o epi Laceration details:    Location:  Face   Face location:  Forehead   Length (cm):  2 Exploration:    Imaging outcome: foreign body not noted     Wound exploration: entire  depth of wound visualized     Contaminated: no   Treatment:    Area cleansed with:  Chlorhexidine   Amount of cleaning:  Extensive   Irrigation solution:  Sterile saline   Irrigation method:  Syringe Skin repair:    Repair method:  Sutures   Suture size:  5-0   Suture material:  Nylon   Suture technique:  Simple interrupted   Number of sutures:  3 Approximation:    Approximation:  Close Repair type:    Repair type:  Simple Post-procedure details:    Dressing:  Open (no dressing)   Procedure completion:  Tolerated well, no immediate complications    MEDICATIONS ORDERED IN ED: Medications  lidocaine (PF) (XYLOCAINE) 1 % injection 5 mL (5 mLs Other Given by Other 05/03/23 1546)  acetaminophen (TYLENOL) tablet 1,000 mg (1,000 mg Oral Given 05/03/23 1546)  Tdap (BOOSTRIX) injection 0.5 mL (0.5 mLs Intramuscular Given 05/03/23 1547)     IMPRESSION / MDM / ASSESSMENT AND PLAN / ED COURSE  I reviewed the triage vital signs and the nursing notes.                              Differential diagnosis includes, but is not limited to, for his fall laceration, will get CT head and neck to rule out intracranial hemorrhage or fractures, will plan to suture the lack.  Unknown Tdap status.  Given a Tdap booster today.  For the fall, possibly mechanical but he did note some lightheadedness prior to the fall.  No urinary symptoms or other infectious symptoms.  Will get a set of labs, troponin, EKG.  Shared decision making with patient and he is agreeable plan.  Patient's presentation is most consistent with acute presentation with potential threat to life or bodily function.  Independent review of labs and imaging are below.  Laceration pair was done without complications.  Discussed laceration care with patient including follow-up with his primary care doctor or come back to the emergency department or go to urgent care to get sutures removed in 5 to 7 days.  On reassessment he is asymptomatic, safe  for outpatient management.  Considered but no indication for inpatient admission at this time.  Shared decision making done with patient and he is agreeable plan for discharge, recommended taking Tylenol every 6 hours as needed for  headache.  Strict and precautions given.  Discharge.  Clinical Course as of 05/03/23 1857  Thu May 03, 2023  1545 CT HEAD WO CONTRAST ( ) IMPRESSION: 1. No acute intracranial abnormality. 2. No acute displaced fracture or traumatic listhesis of the cervical spine.   [TT]  1719 Independent review of labs, no leukocytosis, electrolytes not severely deranged, LFTs are normal, troponin is mildly elevated, will get a repeat troponin.  Patient is asymptomatic at this time other than mild headache. [TT]  1855 Troponin I (High Sensitivity)(!) Troponin is stable. [TT]    Clinical Course User Index [TT] Jodie Echevaria Franchot Erichsen, MD     FINAL CLINICAL IMPRESSION(S) / ED DIAGNOSES   Final diagnoses:  Fall, initial encounter  Laceration of forehead, initial encounter  Lightheaded     Rx / DC Orders   ED Discharge Orders          Ordered    Ambulatory Referral to Primary Care (Establish Care)        05/03/23 1856             Note:  This document was prepared using Dragon voice recognition software and may include unintentional dictation errors.    Claybon Jabs, MD 05/03/23 [phone]

## 2023-05-03 NOTE — Discharge Instructions (Signed)
 Please follow-up with your primary care doctor or go to urgent care or come back to this emergency department to get your sutures out in 5 to 7 days.  Please make sure to keep your laceration site clean and dry for 1 to 2 days.  If you see any redness, swelling, purulent drainage, please return to be seen.  Please do not soak your head in the ocean, tubs, pools, lakes or any other body water until your sutures have been removed.  You can take 650 mg of Tylenol every 6 hours as needed for headache.

## 2023-05-20 DIAGNOSIS — F1090 Alcohol use, unspecified, uncomplicated: Secondary | ICD-10-CM | POA: Insufficient documentation

## 2023-05-20 DIAGNOSIS — F17210 Nicotine dependence, cigarettes, uncomplicated: Secondary | ICD-10-CM | POA: Insufficient documentation

## 2023-05-24 ENCOUNTER — Ambulatory Visit: Payer: Self-pay | Admitting: Nurse Practitioner

## 2023-05-24 DIAGNOSIS — F17210 Nicotine dependence, cigarettes, uncomplicated: Secondary | ICD-10-CM

## 2023-05-24 DIAGNOSIS — E1169 Type 2 diabetes mellitus with other specified complication: Secondary | ICD-10-CM

## 2023-05-24 DIAGNOSIS — N400 Enlarged prostate without lower urinary tract symptoms: Secondary | ICD-10-CM

## 2023-05-24 DIAGNOSIS — Z1159 Encounter for screening for other viral diseases: Secondary | ICD-10-CM

## 2023-05-24 DIAGNOSIS — F1090 Alcohol use, unspecified, uncomplicated: Secondary | ICD-10-CM

## 2023-05-24 DIAGNOSIS — I152 Hypertension secondary to endocrine disorders: Secondary | ICD-10-CM
